# Patient Record
Sex: Male | Born: 1995 | Race: White | Hispanic: Yes | Marital: Married | Smoking: Never smoker
Health system: Southern US, Community
[De-identification: ages and names within clinical notes are randomized; demographics above are authoritative.]

---
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
## 2014-01-16 ENCOUNTER — Encounter (HOSPITAL_COMMUNITY): Payer: Self-pay | Admitting: Emergency Medicine

## 2014-01-16 DIAGNOSIS — Y9289 Other specified places as the place of occurrence of the external cause: Secondary | ICD-10-CM | POA: Insufficient documentation

## 2014-01-16 DIAGNOSIS — F17200 Nicotine dependence, unspecified, uncomplicated: Secondary | ICD-10-CM | POA: Insufficient documentation

## 2014-01-16 DIAGNOSIS — R55 Syncope and collapse: Secondary | ICD-10-CM | POA: Insufficient documentation

## 2014-01-16 DIAGNOSIS — S0083XA Contusion of other part of head, initial encounter: Principal | ICD-10-CM | POA: Insufficient documentation

## 2014-01-16 DIAGNOSIS — S060X9A Concussion with loss of consciousness of unspecified duration, initial encounter: Secondary | ICD-10-CM | POA: Insufficient documentation

## 2014-01-16 DIAGNOSIS — Y9389 Activity, other specified: Secondary | ICD-10-CM | POA: Insufficient documentation

## 2014-01-16 DIAGNOSIS — S1093XA Contusion of unspecified part of neck, initial encounter: Principal | ICD-10-CM

## 2014-01-16 DIAGNOSIS — W1809XA Striking against other object with subsequent fall, initial encounter: Secondary | ICD-10-CM | POA: Insufficient documentation

## 2014-01-16 DIAGNOSIS — S0003XA Contusion of scalp, initial encounter: Secondary | ICD-10-CM | POA: Insufficient documentation

## 2014-01-17 ENCOUNTER — Emergency Department (HOSPITAL_COMMUNITY): Payer: Self-pay

## 2014-01-17 ENCOUNTER — Emergency Department (HOSPITAL_COMMUNITY)
Admission: EM | Admit: 2014-01-17 | Discharge: 2014-01-17 | Disposition: A | Discharge status: Home / Self Care | Payer: Self-pay | Attending: Emergency Medicine | Admitting: Emergency Medicine

## 2014-01-17 DIAGNOSIS — S0003XA Contusion of scalp, initial encounter: Secondary | ICD-10-CM

## 2014-01-17 DIAGNOSIS — I951 Orthostatic hypotension: Secondary | ICD-10-CM

## 2014-01-17 LAB — CBC
HCT: 46.4 % (ref 39.0–52.0)
Hemoglobin: 16.6 g/dL (ref 13.0–17.0)
MCH: 29.8 pg (ref 26.0–34.0)
MCHC: 35.8 g/dL (ref 30.0–36.0)
MCV: 83.3 fL (ref 78.0–100.0)
Platelets: 189 10*3/uL (ref 150–400)
RBC: 5.57 MIL/uL (ref 4.22–5.81)
RDW: 12.1 % (ref 11.5–15.5)
WBC: 8.5 10*3/uL (ref 4.0–10.5)

## 2014-01-17 LAB — COMPREHENSIVE METABOLIC PANEL
ALT: 19 U/L (ref 0–53)
AST: 22 U/L (ref 0–37)
Albumin: 4.6 g/dL (ref 3.5–5.2)
Alkaline Phosphatase: 72 U/L (ref 39–117)
Anion gap: 14 (ref 5–15)
BUN: 20 mg/dL (ref 6–23)
CO2: 24 mEq/L (ref 19–32)
Calcium: 9.3 mg/dL (ref 8.4–10.5)
Chloride: 102 mEq/L (ref 96–112)
Creatinine, Ser: 0.98 mg/dL (ref 0.50–1.35)
GFR calc Af Amer: >90 mL/min (ref >90)
GFR calc non Af Amer: >90 mL/min (ref >90)
Glucose, Bld: 116 mg/dL — ABNORMAL HIGH (ref 70–99)
Potassium: 3.6 mEq/L — ABNORMAL LOW (ref 3.7–5.3)
Sodium: 140 mEq/L (ref 137–147)
Total Bilirubin: 0.8 mg/dL (ref 0.3–1.2)
Total Protein: 8.1 g/dL (ref 6.0–8.3)

## 2014-01-17 LAB — CBG MONITORING, ED: Glucose-Capillary: 93 mg/dL (ref 70–99)

## 2014-01-17 MED ORDER — SODIUM CHLORIDE 0.9 % IV SOLN: 1000.0000 mL | INTRAVENOUS | Status: DC

## 2014-01-17 MED ORDER — SODIUM CHLORIDE 0.9 % IV SOLN
1000.0000 mL | Freq: Once | INTRAVENOUS | Status: AC
  Administered 2014-01-17: 1000 mL via INTRAVENOUS

## 2014-01-17 MED ORDER — KETOROLAC TROMETHAMINE 30 MG/ML IJ SOLN
30.0000 mg | Freq: Once | INTRAMUSCULAR | Status: AC
  Administered 2014-01-17: 30 mg via INTRAVENOUS
  Filled 2014-01-17: qty 1

## 2014-01-17 NOTE — ED Provider Notes (Signed)
CSN: [id number]     Arrival date & time 01/16/14  2322 History   First MD Initiated Contact with Patient 01/17/14 0209     Chief Complaint  Patient presents with  . Loss of Consciousness     (Consider location/radiation/quality/duration/timing/severity/associated sxs/prior Treatment) Patient is a 18 y.o. male presenting with syncope. The history is provided by the patient.  Loss of Consciousness He got up from a sitting position to alter his car to get a case of water out. He was feeling lightheaded when he stood up but he proceeded and got a case of water out of his car and passed out striking the back of his head when he fell. Loss of consciousness was brief. There is no nausea, vomiting, diaphoresis, incoordination. He is complaining of pain in the back of his head they precipitate/10. He states that he been drinking fluids during the course of the day but had not eaten since early in the day. He has never had any episodes like this before.  History reviewed. No pertinent past medical history. History reviewed. No pertinent past surgical history. History reviewed. No pertinent family history. History  Substance Use Topics  . Smoking status: Current Some Day Smoker  . Smokeless tobacco: Not on file  . Alcohol Use: No    Review of Systems  Cardiovascular: Positive for syncope.  All other systems reviewed and are negative.     Allergies  Review of patient's allergies indicates no known allergies.  Home Medications   Prior to Admission medications   Not on File   BP 113/51  Pulse 64  Temp(Src) 98.2 F (36.8 C) (Oral)  Resp 17  Ht 6' (1.829 m)  Wt 190 lb (86.183 kg)  BMI 25.76 kg/m2  SpO2 98% Physical Exam  Nursing note and vitals reviewed.  18 year old male, resting comfortably and in no acute distress. Vital signs are normal. Oxygen saturation is 98%, which is normal. Head is normocephalic. There is mild tenderness to palpation over the occiput. PERRLA, EOMI.  Oropharynx is clear. Neck is nontender without adenopathy or JVD. Back is nontender and there is no CVA tenderness. Lungs are clear without rales, wheezes, or rhonchi. Chest is nontender. Heart has regular rate and rhythm without murmur. Abdomen is soft, flat, nontender without masses or hepatosplenomegaly and peristalsis is normoactive. Extremities have no cyanosis or edema, full range of motion is present. Skin is warm and dry without rash. Neurologic: Mental status is normal, cranial nerves are intact, there are no motor or sensory deficits.  ED Course  Procedures (including critical care time) Labs Review Results for orders placed during the hospital encounter of 01/17/14  CBC      Result Value Ref Range   WBC 8.5  4.0 - 10.5 K/uL   RBC 5.57  4.22 - 5.81 MIL/uL   Hemoglobin 16.6  13.0 - 17.0 g/dL   HCT 46.4  39.0 - 52.0 %   MCV 83.3  78.0 - 100.0 fL   MCH 29.8  26.0 - 34.0 pg   MCHC 35.8  30.0 - 36.0 g/dL   RDW 12.1  11.5 - 15.5 %   Platelets 189  150 - 400 K/uL  COMPREHENSIVE METABOLIC PANEL      Result Value Ref Range   Sodium 140  137 - 147 mEq/L   Potassium 3.6 (*) 3.7 - 5.3 mEq/L   Chloride 102  96 - 112 mEq/L   CO2 24  19 - 32 mEq/L   Glucose, Bld  116 (*) 70 - 99 mg/dL   BUN 20  6 - 23 mg/dL   Creatinine, Ser 0.98  0.50 - 1.35 mg/dL   Calcium 9.3  8.4 - 10.5 mg/dL   Total Protein 8.1  6.0 - 8.3 g/dL   Albumin 4.6  3.5 - 5.2 g/dL   AST 22  0 - 37 U/L   ALT 19  0 - 53 U/L   Alkaline Phosphatase 72  39 - 117 U/L   Total Bilirubin 0.8  0.3 - 1.2 mg/dL   GFR calc non Af Amer >90  >90 mL/min   GFR calc Af Amer >90  >90 mL/min   Anion gap 14  5 - 15  CBG MONITORING, ED      Result Value Ref Range   Glucose-Capillary 93  70 - 99 mg/dL   Imaging Review Ct Head Wo Contrast  01/17/2014   CLINICAL DATA:  Syncopal episode.  Hit head.  EXAM: CT HEAD WITHOUT CONTRAST  TECHNIQUE: Contiguous axial images were obtained from the base of the skull through the vertex without  intravenous contrast.  COMPARISON:  None.  FINDINGS: The ventricles are normal in size and configuration. No extra-axial fluid collections are identified. The gray-white differentiation is normal. No CT findings for acute intracranial process such as hemorrhage or infarction. No mass lesions. The brainstem and cerebellum are grossly normal.  The bony structures are intact. The paranasal sinuses and mastoid air cells are clear. The globes are intact.  IMPRESSION: No acute intracranial findings or skull fracture.   Electronically Signed   By: Kalman Jewels M.D.   On: 01/17/2014 02:44     EKG Interpretation   Date/Time:  Saturday January 17 2014 00:21:33 EDT Ventricular Rate:  79 PR Interval:  158 QRS Duration: 97 QT Interval:  378 QTC Calculation: 433 R Axis:   104 Text Interpretation:  Age not entered, assumed to be  18 years old for  purpose of ECG interpretation Sinus rhythm Consider right ventricular  hypertrophy No old tracing to compare Confirmed by [facility]  MD, Mirra Basilio  ([id number]) on 01/17/2014 12:28:31 AM      MDM   Final diagnoses:  Orthostatic syncope  Contusion of scalp, initial encounter    Syncope which sounds with a static in nature. Orthostatic vital signs did not show diagnostic change in blood pressure or heart rate but there was a increase in heart rate of beats per minute and a drop in systolic blood pressure of about 10 mm mercury. I feel he probably was dehydrated and had an orthostatic episode. He will be sent for a CT of his head and he is given IV fluids in the ED.  CT is unremarkable. He is discharged with instructions to take over-the-counter analgesics as needed for pain.  Delora Fuel, MD 40/81/44 [id number]

## 2014-01-17 NOTE — ED Notes (Signed)
Patient arrives with complaint of loss of consciousness which occurred about 1 hr ago while moving cases of water from car. Family states patient "fell out" on the pavement by the car and they hear a "crack" when his head hit the ground. Repetitive questioning reported by family. Patient was unconscious for 1-2 minutes per family members, was pale, and non-responsive to any stimuli. Patient was seen by EMS at time of injury. Currently A+O x4, complaining of mild posterior head pain. No obvious injury at site of impact. Patient ambulatory in triage without difficulty.

## 2014-01-17 NOTE — Discharge Instructions (Signed)
Drink plenty of fluids. Take acetaminophen (Tylenol), ibuprofen (Motrin, Advil), or naproxen (Aleve) as needed for pain.   Contusion A contusion is a deep bruise. Contusions are the result of an injury that caused bleeding under the skin. The contusion may turn blue, purple, or yellow. Minor injuries will give you a painless contusion, but more severe contusions may stay painful and swollen for a few weeks.  CAUSES  A contusion is usually caused by a blow, trauma, or direct force to an area of the body. SYMPTOMS   Swelling and redness of the injured area.  Bruising of the injured area.  Tenderness and soreness of the injured area.  Pain. DIAGNOSIS  The diagnosis can be made by taking a history and physical exam. An X-ray, CT scan, or MRI may be needed to determine if there were any associated injuries, such as fractures. TREATMENT  Specific treatment will depend on what area of the body was injured. In general, the best treatment for a contusion is resting, icing, elevating, and applying cold compresses to the injured area. Over-the-counter medicines may also be recommended for pain control. Ask your caregiver what the best treatment is for your contusion. HOME CARE INSTRUCTIONS   Put ice on the injured area.  Put ice in a plastic bag.  Place a towel between your skin and the bag.  Leave the ice on for 15-20 minutes, 3-4 times a day, or as directed by your health care provider.  Only take over-the-counter or prescription medicines for pain, discomfort, or fever as directed by your caregiver. Your caregiver may recommend avoiding anti-inflammatory medicines (aspirin, ibuprofen, and naproxen) for 48 hours because these medicines may increase bruising.  Rest the injured area.  If possible, elevate the injured area to reduce swelling. SEEK IMMEDIATE MEDICAL CARE IF:   You have increased bruising or swelling.  You have pain that is getting worse.  Your swelling or pain is not  relieved with medicines. MAKE SURE YOU:   Understand these instructions.  Will watch your condition.  Will get help right away if you are not doing well or get worse. Document Released: 02/01/2005 Document Revised: 04/29/2013 Document Reviewed: 02/27/2011 [facility] Patient Information 2015 [place] [address] [place], [place]. This information is not intended to replace advice given to you by your health care provider. Make sure you discuss any questions you have with your health care provider.  Syncope Syncope is a medical term for fainting or passing out. This means you lose consciousness and drop to the ground. People are generally unconscious for less than 5 minutes. You may have some muscle twitches for up to 15 seconds before waking up and returning to normal. Syncope occurs more often in older adults, but it can happen to anyone. While most causes of syncope are not dangerous, syncope can be a sign of a serious medical problem. It is important to seek medical care.  CAUSES  Syncope is caused by a sudden drop in blood flow to the brain. The specific cause is often not determined. Factors that can bring on syncope include:  Taking medicines that lower blood pressure.  Sudden changes in posture, such as standing up quickly.  Taking more medicine than prescribed.  Standing in one place for too long.  Seizure disorders.  Dehydration and excessive exposure to heat.  Low blood sugar (hypoglycemia).  Straining to have a bowel movement.  Heart disease, irregular heartbeat, or other circulatory problems.  Fear, emotional distress, seeing blood, or severe pain. SYMPTOMS  Right before fainting,  you may:  Feel dizzy or light-headed.  Feel nauseous.  See all white or all black in your field of vision.  Have cold, clammy skin. DIAGNOSIS  Your health care provider will ask about your symptoms, perform a physical exam, and perform an electrocardiogram (ECG) to record the electrical activity of your  heart. Your health care provider may also perform other heart or blood tests to determine the cause of your syncope which may include:  Transthoracic echocardiogram (TTE). During echocardiography, sound waves are used to evaluate how blood flows through your heart.  Transesophageal echocardiogram (TEE).  Cardiac monitoring. This allows your health care provider to monitor your heart rate and rhythm in real time.  Holter monitor. This is a portable device that records your heartbeat and can help diagnose heart arrhythmias. It allows your health care provider to track your heart activity for several days, if needed.  Stress tests by exercise or by giving medicine that makes the heart beat faster. TREATMENT  In most cases, no treatment is needed. Depending on the cause of your syncope, your health care provider may recommend changing or stopping some of your medicines. HOME CARE INSTRUCTIONS  Have someone stay with you until you feel stable.  Do not drive, use machinery, or play sports until your health care provider says it is okay.  Keep all follow-up appointments as directed by your health care provider.  Lie down right away if you start feeling like you might faint. Breathe deeply and steadily. Wait until all the symptoms have passed.  Drink enough fluids to keep your urine clear or pale yellow.  If you are taking blood pressure or heart medicine, get up slowly and take several minutes to sit and then stand. This can reduce dizziness. SEEK IMMEDIATE MEDICAL CARE IF:   You have a severe headache.  You have unusual pain in the chest, abdomen, or back.  You are bleeding from your mouth or rectum, or you have black or tarry stool.  You have an irregular or very fast heartbeat.  You have pain with breathing.  You have repeated fainting or seizure-like jerking during an episode.  You faint when sitting or lying down.  You have confusion.  You have trouble walking.  You have  severe weakness.  You have vision problems. If you fainted, call your local emergency services (911 in [place] Do not drive yourself to the hospital.  MAKE SURE YOU:  Understand these instructions.  Will watch your condition.  Will get help right away if you are not doing well or get worse. Document Released: 04/24/2005 Document Revised: 04/29/2013 Document Reviewed: 06/23/2011 [facility] Patient Information 2015 [place], [place]. This information is not intended to replace advice given to you by your health care provider. Make sure you discuss any questions you have with your health care provider.

## 2014-01-17 NOTE — ED Notes (Signed)
CBG 93 

## 2014-01-17 NOTE — ED Notes (Signed)
Pt with small raised area on the back of his head.

## 2016-08-17 ENCOUNTER — Ambulatory Visit (INDEPENDENT_AMBULATORY_CARE_PROVIDER_SITE_OTHER): Payer: Managed Care, Other (non HMO) | Admitting: Nurse Practitioner

## 2016-08-17 ENCOUNTER — Encounter: Payer: Self-pay | Admitting: Nurse Practitioner

## 2016-08-17 VITALS — BP 124/80 | HR 52 | Temp 98.2°F | Ht 71.0 in | Wt 187.0 lb

## 2016-08-17 DIAGNOSIS — L918 Other hypertrophic disorders of the skin: Secondary | ICD-10-CM | POA: Diagnosis not present

## 2016-08-17 DIAGNOSIS — Z13220 Encounter for screening for lipoid disorders: Secondary | ICD-10-CM | POA: Diagnosis not present

## 2016-08-17 DIAGNOSIS — Z136 Encounter for screening for cardiovascular disorders: Secondary | ICD-10-CM

## 2016-08-17 DIAGNOSIS — Z0001 Encounter for general adult medical examination with abnormal findings: Secondary | ICD-10-CM | POA: Diagnosis not present

## 2016-08-17 DIAGNOSIS — Z114 Encounter for screening for human immunodeficiency virus [HIV]: Secondary | ICD-10-CM | POA: Diagnosis not present

## 2016-08-17 NOTE — Progress Notes (Signed)
Subjective:    Patient ID: Billy Espinoza, male    DOB: [birth year], 23 y.o.   MRN: [id number]  Patient presents today for establish care (new patient)  HPI   left axillary lesion: onset several years ago. Waxing and waning Associated with pain. Does not shave. Left axillary lesion, increased in size over last few months and become more irritated No recent HIV test. No hx of STD.  Immunizations: (TDAP, Hep C screen, Pneumovax, Influenza, zoster)  Health Maintenance  Topic Date Due  . HIV Screening  11/10/2008  . Tetanus Vaccine  08/06/2017*  . Flu Shot  12/06/2016  *Topic was postponed. The date shown is not the original due date.   Diet:healthy Weight:  Wt Readings from Last 3 Encounters:  08/17/16 187 lb (84.8 kg)  01/16/14 190 lb (86.2 kg)   Exercise:none Fall Risk: fall as result of acute illness which led to dehydration, he was evaluated by provider in ED (unable to remember name). No acute finding. Fall Risk  08/17/2016  Falls in the past year? Yes  Number falls in past yr: 1  Injury with Fall? No   Home Safety: home with wife and son Depression/Suicide: Depression screen [facility] 2/9 08/17/2016  Decreased Interest 0  Down, Depressed, Hopeless 0  PHQ - 2 Score 0   No flowsheet data found. Vision:needed Dental:needed Advanced Directive: Advanced Directives 01/16/2014  Does Patient Have a Medical Advance Directive? No   Sexual History (birth control, marital status, STD):married, sexually active with wife only.  Medications and allergies reviewed with patient and updated if appropriate.  There are no active problems to display for this patient.   No current outpatient prescriptions on file prior to visit.   No current facility-administered medications on file prior to visit.     History reviewed. No pertinent past medical history.  History reviewed. No pertinent surgical history.  Social History   Social History  . Marital status: Single    Spouse  name: N/A  . Number of children: N/A  . Years of education: N/A   Social History Main Topics  . Smoking status: Never Smoker  . Smokeless tobacco: Never Used  . Alcohol use No  . Drug use: No  . Sexual activity: Not Asked   Other Topics Concern  . None   Social History Narrative  . None    Family History  Problem Relation Age of Onset  . Diabetes Maternal Grandmother         Review of Systems  Constitutional: Negative for fever, malaise/fatigue and weight loss.  HENT: Negative for congestion and sore throat.   Eyes:       Negative for visual changes  Respiratory: Negative for cough and shortness of breath.   Cardiovascular: Negative for chest pain, palpitations and leg swelling.  Gastrointestinal: Negative for blood in stool, constipation, diarrhea and heartburn.  Genitourinary: Negative for dysuria, frequency and urgency.  Musculoskeletal: Negative for falls, joint pain and myalgias.  Skin: Negative for rash.  Neurological: Negative for dizziness, sensory change and headaches.  Endo/Heme/Allergies: Does not bruise/bleed easily.  Psychiatric/Behavioral: Negative for depression, substance abuse and suicidal ideas. The patient is not nervous/anxious.     Objective:   Vitals:   08/17/16 1130  BP: 124/80  Pulse: (!) 52  Temp: 98.2 F (36.8 C)    Body mass index is 26.08 kg/m.   Physical Examination:  Physical Exam  Constitutional: He is oriented to person, place, and time and well-developed, well-nourished, and in  no distress. No distress.  HENT:  Right Ear: External ear normal.  Left Ear: External ear normal.  Nose: Nose normal.  Mouth/Throat: Oropharynx is clear and moist. No oropharyngeal exudate.  Eyes: Conjunctivae and EOM are normal. Pupils are equal, round, and reactive to light. No scleral icterus.  Neck: Normal range of motion. Neck supple. No thyromegaly present.  Cardiovascular: Normal rate, normal heart sounds and intact distal pulses.     Pulmonary/Chest: Effort normal and breath sounds normal. He exhibits no tenderness.  Abdominal: Soft. Bowel sounds are normal. He exhibits no distension. There is no tenderness.  Musculoskeletal: Normal range of motion. He exhibits no edema or tenderness.  Lymphadenopathy:    He has no cervical adenopathy.  Neurological: He is alert and oriented to person, place, and time. Gait normal.  Skin: Skin is warm and dry. Lesion noted. No rash noted.  Hyperpigmented left axilla lesion, non tender, no induration.  Psychiatric: Affect and judgment normal.   Left Axilla Biopsy: Procedure including risks/benefits explained to patient.   Questions were answered.  After verbal informed consent was obtained and a time out completed. Site was cleansed with betadine and then alcohol. 1% Lidocaine with epinephrine was injected under lesion and then shave biopsy was performed. Area was cauterized to obtain hemostasis.  Pt tolerated procedure well.  Specimen sent for pathology review.  Pt instructed to keep the area dry for 24 hours and to contact us if he develops redness, drainage or swelling at the site.  Pt may use tylenol as needed for discomfort today.   ASSESSMENT and PLAN:  [name] was seen today for establish care.  Diagnoses and all orders for this visit:  Encounter for preventative adult health care exam with abnormal findings -     CBC; Future -     HIV antibody; Future -     Comprehensive metabolic panel; Future -     Lipid panel; Future -     TSH; Future  Inflamed skin tag -     Dermatology pathology  Encounter for lipid screening for cardiovascular disease -     Lipid panel; Future  Screening for HIV (human immunodeficiency virus) -     HIV antibody; Future   No problem-specific Assessment & Plan notes found for this encounter.     Follow up: Return if symptoms worsen or fail to improve.  Wilfred Lacy, NP

## 2016-08-17 NOTE — Patient Instructions (Signed)
Return to lab fasting at least 6-8hrs prior to blood draw.   Skin Biopsy, Care After Refer to this sheet in the next few weeks. These instructions provide you with information about caring for yourself after your procedure. Your health care provider may also give you more specific instructions. Your treatment has been planned according to current medical practices, but problems sometimes occur. Call your health care provider if you have any problems or questions after your procedure. What can I expect after the procedure? After the procedure, it is common to have:  Soreness.  Bruising.  Itching. Follow these instructions at home:  Rest and then return to your normal activities as told by your health care provider.  Take over-the-counter and prescription medicines only as told by your health care provider.  Follow instructions from your health care provider about how to take care of your biopsy site.Make sure you:  Wash your hands with soap and water before you change your bandage (dressing). If soap and water are not available, use hand sanitizer.  Change your dressing as told by your health care provider.  Leave stitches (sutures), skin glue, or adhesive strips in place. These skin closures may need to stay in place for 2 weeks or longer. If adhesive strip edges start to loosen and curl up, you may trim the loose edges. Do not remove adhesive strips completely unless your health care provider tells you to do that. If the biopsy area bleeds, apply gentle pressure for 10 minutes.  Check your biopsy site every day for signs of infection. Check for:  More redness, swelling, or pain.  More fluid or blood.  Warmth.  Pus or a bad smell.  Keep all follow-up visits as told by your health care provider. This is important. Contact a health care provider if:  You have more redness, swelling, or pain around your biopsy site.  You have more fluid or blood coming from your biopsy  site.  Your biopsy site feels warm to the touch.  You have pus or a bad smell coming from your biopsy site.  You have a fever. Get help right away if:  You have bleeding that does not stop with pressure or a dressing. This information is not intended to replace advice given to you by your health care provider. Make sure you discuss any questions you have with your health care provider. Document Released: 05/21/2015 Document Revised: 12/19/2015 Document Reviewed: 07/22/2014 Elsevier Interactive Patient Education  2017 [employer].

## 2016-08-17 NOTE — Progress Notes (Signed)
Pre visit review using our clinic review tool, if applicable. No additional management support is needed unless otherwise documented below in the visit note. 

## 2016-08-22 ENCOUNTER — Encounter: Payer: Self-pay | Admitting: Nurse Practitioner

## 2016-12-17 ENCOUNTER — Encounter (HOSPITAL_COMMUNITY): Payer: Self-pay | Admitting: Emergency Medicine

## 2016-12-17 ENCOUNTER — Ambulatory Visit (INDEPENDENT_AMBULATORY_CARE_PROVIDER_SITE_OTHER): Payer: Managed Care, Other (non HMO)

## 2016-12-17 ENCOUNTER — Ambulatory Visit (HOSPITAL_COMMUNITY)
Admission: EM | Admit: 2016-12-17 | Discharge: 2016-12-17 | Disposition: A | Discharge status: Home / Self Care | Payer: Managed Care, Other (non HMO) | Attending: Family Medicine | Admitting: Family Medicine

## 2016-12-17 DIAGNOSIS — M659 Synovitis and tenosynovitis, unspecified: Secondary | ICD-10-CM

## 2016-12-17 DIAGNOSIS — M65931 Unspecified synovitis and tenosynovitis, right forearm: Secondary | ICD-10-CM

## 2016-12-17 MED ORDER — MELOXICAM 15 MG PO TABS: 15.0000 mg | ORAL_TABLET | Freq: Every day | ORAL | 0 refills | Status: DC

## 2016-12-17 NOTE — Discharge Instructions (Signed)
Your x-rays were normal. You most likely have a condition called tenosynovitis. Recommend rest, ice, elevation when possible. We have placed her wrist in a splint. I recommend wearing it as much a possible, and especially every night at bedtime. I prescribed a medicine called meloxicam, take one tablet daily. You can take Tylenol with this if needed, but no Motrin, naproxen, or or over-the-counter NSAIDs. If pain persists past 2-3 weeks, follow up with your primary care provider, as you may need a referral to physical therapy

## 2016-12-17 NOTE — ED Triage Notes (Signed)
The patient presented to the [facility] with a complaint of right wrist pain that has been chronic but got worse last night after bowling.

## 2016-12-17 NOTE — ED Provider Notes (Signed)
  [place]   [id number] 12/17/16 Arrival Time: 4098  ASSESSMENT & PLAN:  1. Tenosynovitis of right wrist     Meds ordered this encounter  Medications  . meloxicam (MOBIC) 15 MG tablet    Sig: Take 1 tablet (15 mg total) by mouth daily.    Dispense:  30 tablet    Refill:  0    Order Specific Question:   Supervising Provider    Answer:   Robyn Haber [[id number]]   Recommend RICE, Anti-inflammatories, follow-up with primary care Reviewed expectations re: course of current medical issues. Questions answered. Outlined signs and symptoms indicating need for more acute intervention. Patient verbalized understanding. After Visit Summary given.   SUBJECTIVE:  Billy Espinoza is a 21 y.o. male who presents with complaint of right wrist pain has been ongoing for approximately one year, but worsened after last night while bowling. He denies any trauma, has not fallen, or had any injury. However, he does work in a job with lots of repetitive motion. He has a Clinical research associate for [employer]. He has no swelling, no systemic symptoms such as fever or chills. Otherwise history is unremarkable  ROS: As per HPI, remainder of ROS negative.   OBJECTIVE:  Vitals:   12/17/16 1257  BP: 125/77  Pulse: (!) 58  Resp: 16  Temp: 98.6 F (37 C)  TempSrc: Oral  SpO2: 99%     General appearance: alert; no distress, Appears stated age HEENT: normocephalic; atraumatic; conjunctivae normal;  Extremities: no cyanosis or edema; symmetrical with no gross deformities; negative Finkelstein, negative Tinel, negative Phalen, negative point tenderness on the extensor tendons medially, also tenderness distal to the on the styloid. Grip strengths normal, no decrease in sensory function along the ulnar, medial, radial nerves, capillary refill is less than 2 seconds Skin: warm and dry Neurologic: Grossly normal Psychological:  alert and cooperative; normal mood and affect  Procedures:      Labs Reviewed -  No data to display  Dg Wrist Complete Right  Result Date: 12/17/2016 CLINICAL DATA:  Pain EXAM: RIGHT WRIST - COMPLETE 3+ VIEW COMPARISON:  None. FINDINGS: There is no evidence of fracture or dislocation. There is no evidence of arthropathy or other focal bone abnormality. Soft tissues are unremarkable. IMPRESSION: Negative. Electronically Signed   By: Franchot Gallo M.D.   On: 12/17/2016 13:58    No Known Allergies  PMHx, SurgHx, SocialHx, Medications, and Allergies were reviewed in the Visit Navigator and updated as appropriate.       Barnet Glasgow, NP 12/17/16 [phone]

## 2018-02-26 ENCOUNTER — Ambulatory Visit (INDEPENDENT_AMBULATORY_CARE_PROVIDER_SITE_OTHER): Payer: BLUE CROSS/BLUE SHIELD | Admitting: Nurse Practitioner

## 2018-02-26 ENCOUNTER — Encounter: Payer: Self-pay | Admitting: Nurse Practitioner

## 2018-02-26 VITALS — BP 132/72 | HR 71 | Temp 98.6°F | Ht 71.0 in | Wt 200.0 lb

## 2018-02-26 DIAGNOSIS — Z136 Encounter for screening for cardiovascular disorders: Secondary | ICD-10-CM

## 2018-02-26 DIAGNOSIS — Z13220 Encounter for screening for lipoid disorders: Secondary | ICD-10-CM

## 2018-02-26 DIAGNOSIS — Z0000 Encounter for general adult medical examination without abnormal findings: Secondary | ICD-10-CM | POA: Diagnosis not present

## 2018-02-26 DIAGNOSIS — Z114 Encounter for screening for human immunodeficiency virus [HIV]: Secondary | ICD-10-CM

## 2018-02-26 DIAGNOSIS — Z23 Encounter for immunization: Secondary | ICD-10-CM | POA: Diagnosis not present

## 2018-02-26 LAB — LIPID PANEL
CHOLESTEROL: 179 mg/dL (ref 0–200)
HDL: 64.6 mg/dL (ref >39.00)
LDL Cholesterol: 90 mg/dL (ref 0–99)
NonHDL: 114.89
Total CHOL/HDL Ratio: 3
Triglycerides: 126 mg/dL (ref 0.0–149.0)
VLDL: 25.2 mg/dL (ref 0.0–40.0)

## 2018-02-26 LAB — COMPREHENSIVE METABOLIC PANEL
ALBUMIN: 4.7 g/dL (ref 3.5–5.2)
ALK PHOS: 65 U/L (ref 39–117)
ALT: 16 U/L (ref 0–53)
AST: 15 U/L (ref 0–37)
BUN: 15 mg/dL (ref 6–23)
CALCIUM: 9.6 mg/dL (ref 8.4–10.5)
CO2: 30 mEq/L (ref 19–32)
Chloride: 103 mEq/L (ref 96–112)
Creatinine, Ser: 0.81 mg/dL (ref 0.40–1.50)
GFR: 124.12 mL/min (ref >60.00)
Glucose, Bld: 94 mg/dL (ref 70–99)
POTASSIUM: 4 meq/L (ref 3.5–5.1)
Sodium: 139 mEq/L (ref 135–145)
Total Bilirubin: 0.5 mg/dL (ref 0.2–1.2)
Total Protein: 7.5 g/dL (ref 6.0–8.3)

## 2018-02-26 LAB — CBC
HCT: 47.7 % (ref 39.0–52.0)
HEMOGLOBIN: 16.5 g/dL (ref 13.0–17.0)
MCHC: 34.6 g/dL (ref 30.0–36.0)
MCV: 87 fl (ref 78.0–100.0)
PLATELETS: 191 10*3/uL (ref 150.0–400.0)
RBC: 5.48 Mil/uL (ref 4.22–5.81)
RDW: 12.7 % (ref 11.5–15.5)
WBC: 4.4 10*3/uL (ref 4.0–10.5)

## 2018-02-26 LAB — TSH: TSH: 1.54 u[IU]/mL (ref 0.35–4.50)

## 2018-02-26 NOTE — Progress Notes (Signed)
Subjective:    Patient ID: Billy Espinoza, male    DOB: [birth year], 22 y.o.   MRN: [id number]  Patient presents today for complete physical   HPI  Denies any acute complains. No change in health since last OV. 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 Denies any injuries at work in last 57months.  Sexual History (orientation,birth control, marital status, STD):married, sexually active with wife only, father of 2boys  Depression/Suicide: Depression screen [facility] 2/9 02/26/2018 08/17/2016  Decreased Interest 0 0  Down, Depressed, Hopeless 0 0  PHQ - 2 Score 0 0    Vision: not needed per patient  Dental:will schedule.  Immunizations: (TDAP, Hep C screen, Pneumovax, Influenza, zoster)  Health Maintenance  Topic Date Due  . HIV Screening  11/10/2008  . Tetanus Vaccine  02/27/2028  . Flu Shot  Completed   Diet:regular.  Weight:  Wt Readings from Last 3 Encounters:  02/26/18 200 lb (90.7 kg)  08/17/16 187 lb (84.8 kg)  01/16/14 190 lb (86.2 kg)   Exercise:walking, lifting  Fall Risk: Fall Risk  02/26/2018 08/17/2016  Falls in the past year? No Yes  Number falls in past yr: - 1  Injury with Fall? - No   Advanced Directive: Advanced Directives 01/16/2014  Does Patient Have a Medical Advance Directive? No     Medications and allergies reviewed with patient and updated if appropriate.  There are no active problems to display for this patient.   No current outpatient medications on file prior to visit.   No current facility-administered medications on file prior to visit.     History reviewed. No pertinent past medical history.  History reviewed. No pertinent surgical history.  Social History   Socioeconomic History  . Marital status: Married    Spouse name: Not on file  . Number of children: 2  . Years of education: Not on file  . Highest education level: Not on file  Occupational  History  . Not on file  Social Needs  . Financial resource strain: Not on file  . Food insecurity:    Worry: Not on file    Inability: Not on file  . Transportation needs:    Medical: Not on file    Non-medical: Not on file  Tobacco Use  . Smoking status: Never Smoker  . Smokeless tobacco: Never Used  Substance and Sexual Activity  . Alcohol use: No  . Drug use: No  . Sexual activity: Yes  Lifestyle  . Physical activity:    Days per week: Not on file    Minutes per session: Not on file  . Stress: Not on file  Relationships  . Social connections:    Talks on phone: Not on file    Gets together: Not on file    Attends religious service: Not on file    Active member of club or organization: Not on file    Attends meetings of clubs or organizations: Not on file    Relationship status: Not on file  Other Topics Concern  . Not on file  Social History Narrative  . Not on file    Family History  Problem Relation Age of Onset  . Diabetes Maternal Grandmother         Review of Systems  Constitutional: Negative for fever, malaise/fatigue and weight loss.  HENT: Negative for congestion and sore throat.   Eyes:       Negative  for visual changes  Respiratory: Negative for cough and shortness of breath.   Cardiovascular: Negative for chest pain, palpitations and leg swelling.  Gastrointestinal: Negative for blood in stool, constipation, diarrhea and heartburn.  Genitourinary: Negative for dysuria, frequency and urgency.  Musculoskeletal: Negative for falls, joint pain and myalgias.  Skin: Negative for rash.  Neurological: Negative for dizziness, sensory change and headaches.  Endo/Heme/Allergies: Does not bruise/bleed easily.  Psychiatric/Behavioral: Negative for depression, substance abuse and suicidal ideas. The patient is not nervous/anxious.     Objective:   Vitals:   02/26/18 0954  BP: 132/72  Pulse: 71  Temp: 98.6 F (37 C)  SpO2: 97%    Body mass index is  27.89 kg/m.   Physical Examination:  Physical Exam  Constitutional: He is oriented to person, place, and time. He appears well-developed. No distress.  HENT:  Right Ear: External ear normal.  Left Ear: External ear normal.  Nose: Nose normal.  Mouth/Throat: Oropharynx is clear and moist. No oropharyngeal exudate.  Eyes: Pupils are equal, round, and reactive to light. Conjunctivae and EOM are normal.  Neck: Normal range of motion. Neck supple.  Cardiovascular: Normal rate, regular rhythm and normal heart sounds.  Pulmonary/Chest: Effort normal and breath sounds normal. No respiratory distress. He exhibits no tenderness.  Abdominal: Soft. Bowel sounds are normal. He exhibits no distension. There is no tenderness. There is no guarding.  Genitourinary:  Genitourinary Comments: Declined. Reports he performs testicular exam at home. No abnormality noted  Musculoskeletal: Normal range of motion. He exhibits no edema.  Lymphadenopathy:    He has no cervical adenopathy.  Neurological: He is alert and oriented to person, place, and time. He has normal reflexes.  Skin: Skin is warm and dry. No rash noted.  Psychiatric: He has a normal mood and affect. His behavior is normal. Thought content normal.  Vitals reviewed.   ASSESSMENT and PLAN:  Loki was seen today for annual exam.  Diagnoses and all orders for this visit:  Preventative health care -     TSH -     CBC -     Comprehensive metabolic panel -     Lipid panel -     HIV Antibody (routine testing w rflx)  Encounter for lipid screening for cardiovascular disease -     Lipid panel  Encounter for screening for human immunodeficiency virus (HIV) -     HIV Antibody (routine testing w rflx)  Need for influenza vaccination -     Flu Vaccine QUAD 36+ mos IM  Need for diphtheria-tetanus-pertussis (Tdap) vaccine -     Tdap vaccine greater than or equal to 7yo IM   No problem-specific Assessment & Plan notes found for this  encounter.     Follow up: Return if symptoms worsen or fail to improve.  Wilfred Lacy, NP

## 2018-02-26 NOTE — Patient Instructions (Signed)
Go to lab for blood draw  Preventive Care for Lamb, Male The transition to life after high school as a young adult can be a stressful time with many changes. You may start seeing a primary care physician instead of a pediatrician. This is the time when your health care becomes your responsibility. Preventive care refers to lifestyle choices and visits with your health care provider that can promote health and wellness. What does preventive care include?  A yearly physical exam. This is also called an annual wellness visit.  Dental exams once or twice a year.  Routine eye exams. Ask your health care provider how often you should have your eyes checked.  Personal lifestyle choices, including: ? Daily care of your teeth and gums. ? Regular physical activity. ? Eating a healthy diet. ? Avoiding tobacco and drug use. ? Avoiding or limiting alcohol use. ? Practicing safe sex. What happens during an annual wellness visit? Preventive care starts with a yearly visit to your primary care physician. The services and screenings done by your health care provider during your annual wellness visit will depend on your overall health, lifestyle risk factors, and family history of disease. Counseling Your health care provider may ask you questions about:  Past medical problems and your family's medical history.  Medicines or supplements that you take.  Health insurance and access to health care.  Alcohol, tobacco, and drug use, including use of any bodybuilding drugs (anabolic steroids).  Your safety at home, work, or school.  Access to firearms.  Emotional well-being and how you cope with stress.  Relationship well-being.  Diet, exercise, and sleep habits.  Your sexual health and activity.  Screening You may have the following tests or measurements:  Height, weight, and BMI.  Blood pressure.  Lipid and cholesterol levels.  Tuberculosis skin test.  Skin exam.  Vision  and hearing tests.  Genital exam to check for testicular cancer or hernias.  Screening test for hepatitis.  Screening tests for STDs (sexually transmitted diseases), if you are at risk.  Vaccines Your health care provider may recommend certain vaccines, such as:  Influenza vaccine. This is recommended every year.  Tetanus, diphtheria, and acellular pertussis (Tdap, Td) vaccine. You may need a Td booster every 10 years.  Varicella vaccine. You may need this if you have not been vaccinated.  HPV vaccine. If you are 106 or younger, you may need three doses over 6 months.  Measles, mumps, and rubella (MMR) vaccine. You may need at least one dose of MMR. You may also need a second dose.  Pneumococcal 13-valent conjugate (PCV13) vaccine. You may need this if you have certain conditions and have not been vaccinated.  Pneumococcal polysaccharide (PPSV23) vaccine. You may need one or two doses if you smoke cigarettes or if you have certain conditions.  Meningococcal vaccine. One dose is recommended if you are age 34-21 years and a first-year college student living in a residence hall, or if you have one of several medical conditions. You may also need additional booster doses.  Hepatitis A vaccine. You may need this if you have certain conditions or if you travel or work in places where you may be exposed to hepatitis A.  Hepatitis B vaccine. You may need this if you have certain conditions or if you travel or work in places where you may be exposed to hepatitis B.  Haemophilus influenzae type b (Hib) vaccine. You may need this if you have certain risk factors.  Talk  to your health care provider about which screenings and vaccines you need and how often you need them. What steps can I take to develop healthy behaviors?  Have regular preventive health care visits with your primary care physician and dentist.  Eat a healthy diet.  Drink enough fluid to keep your urine clear or pale  yellow.  Stay active. Exercise at least 30 minutes 5 or more days of the week.  Use alcohol responsibly.  Maintain a healthy weight.  Do not use any products that contain nicotine, such as cigarettes, chewing tobacco, and e-cigarettes. If you need help quitting, ask your health care provider.  Do not use drugs.  Practice safe sex. This includes using condoms to prevent STDs or an unwanted pregnancy.  Find healthy ways to manage stress. How can I protect myself from injury? Injuries from violence or accidents are the leading cause of death among young adults and can often be prevented. Take these steps to help protect yourself:  Always wear your seat belt while driving or riding in a vehicle.  Do not drive if you have been drinking alcohol. Do not ride with someone who has been drinking.  Do not drive when you are tired or distracted. Do not text while driving.  Wear a helmet and other protective equipment during sports activities.  If you have firearms in your house, make sure you follow all gun safety procedures.  Seek help if you have been bullied, physically abused, or sexually abused.  Avoid fighting.  Use the Internet responsibly to avoid dangers such as online bullying.  What can I do to cope with stress? Young adults may face many new challenges that can be stressful, such as finding a job, going to college, moving away from home, managing money, being in a relationship, getting married, and having children. To manage stress:  Avoid known stressful situations when you can.  Exercise regularly.  Find a stress-reducing activity that works best for you. Examples include meditation, yoga, listening to music, or reading.  Spend time in nature.  Keep a journal to write about your stress and how you respond.  Talk to your health care provider about stress. He or she may suggest counseling.  Spend time with supportive friends or family.  Do not cope with stress  by: ? Drinking alcohol or using drugs. ? Smoking cigarettes. ? Eating.  Where can I get more information? Learn more about preventive care and healthy habits from:  U.S. Preventive Services Task Force: StageSync.si  National Adolescent and [place]: StrategicRoad.nl  American Academy of Pediatrics Bright Futures: https://brightfutures.MemberVerification.co.za  Society for Adolescent Health and Medicine: MoralBlog.co.za.aspx  PodExchange.nl: ToyLending.fr  This information is not intended to replace advice given to you by your health care provider. Make sure you discuss any questions you have with your health care provider. Document Released: 09/09/2015 Document Revised: 09/30/2015 Document Reviewed: 09/09/2015 Elsevier Interactive Patient Education  [employer].

## 2018-02-27 LAB — HIV ANTIBODY (ROUTINE TESTING W REFLEX): HIV 1&2 Ab, 4th Generation: NONREACTIVE

## 2018-03-06 ENCOUNTER — Encounter: Payer: Self-pay | Admitting: Nurse Practitioner

## 2018-12-03 IMAGING — DX DG WRIST COMPLETE 3+V*R*
4 series · 4 of 4 positions shown · non-contrast
Comparison: None.

CLINICAL DATA: Pain

EXAM:
RIGHT WRIST - COMPLETE 3+ VIEW

[wrist pa]
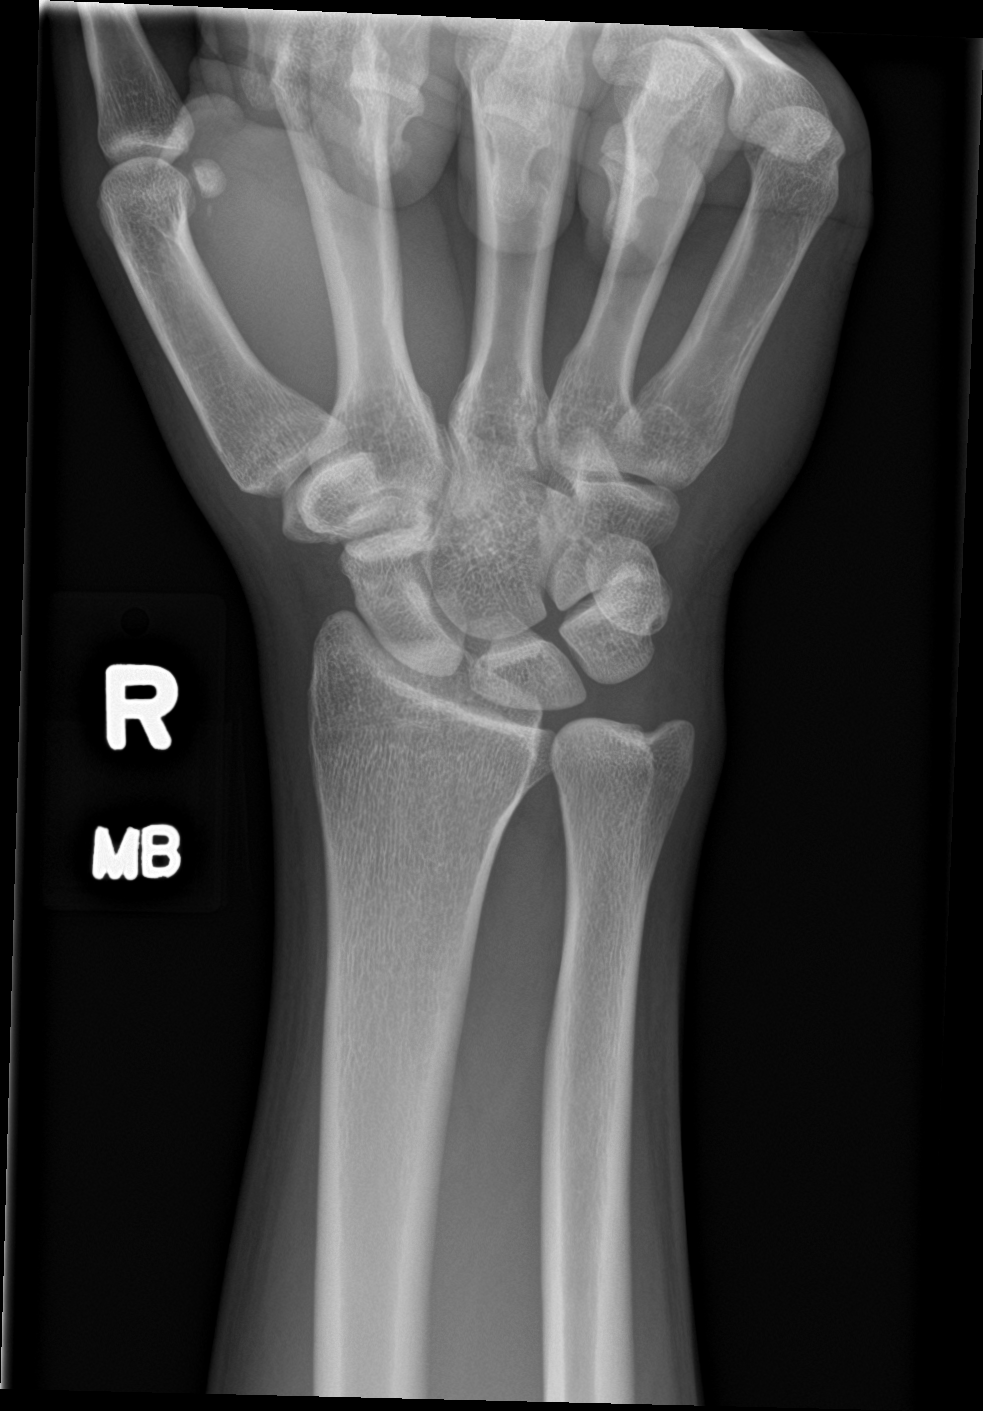

[wrist obl]
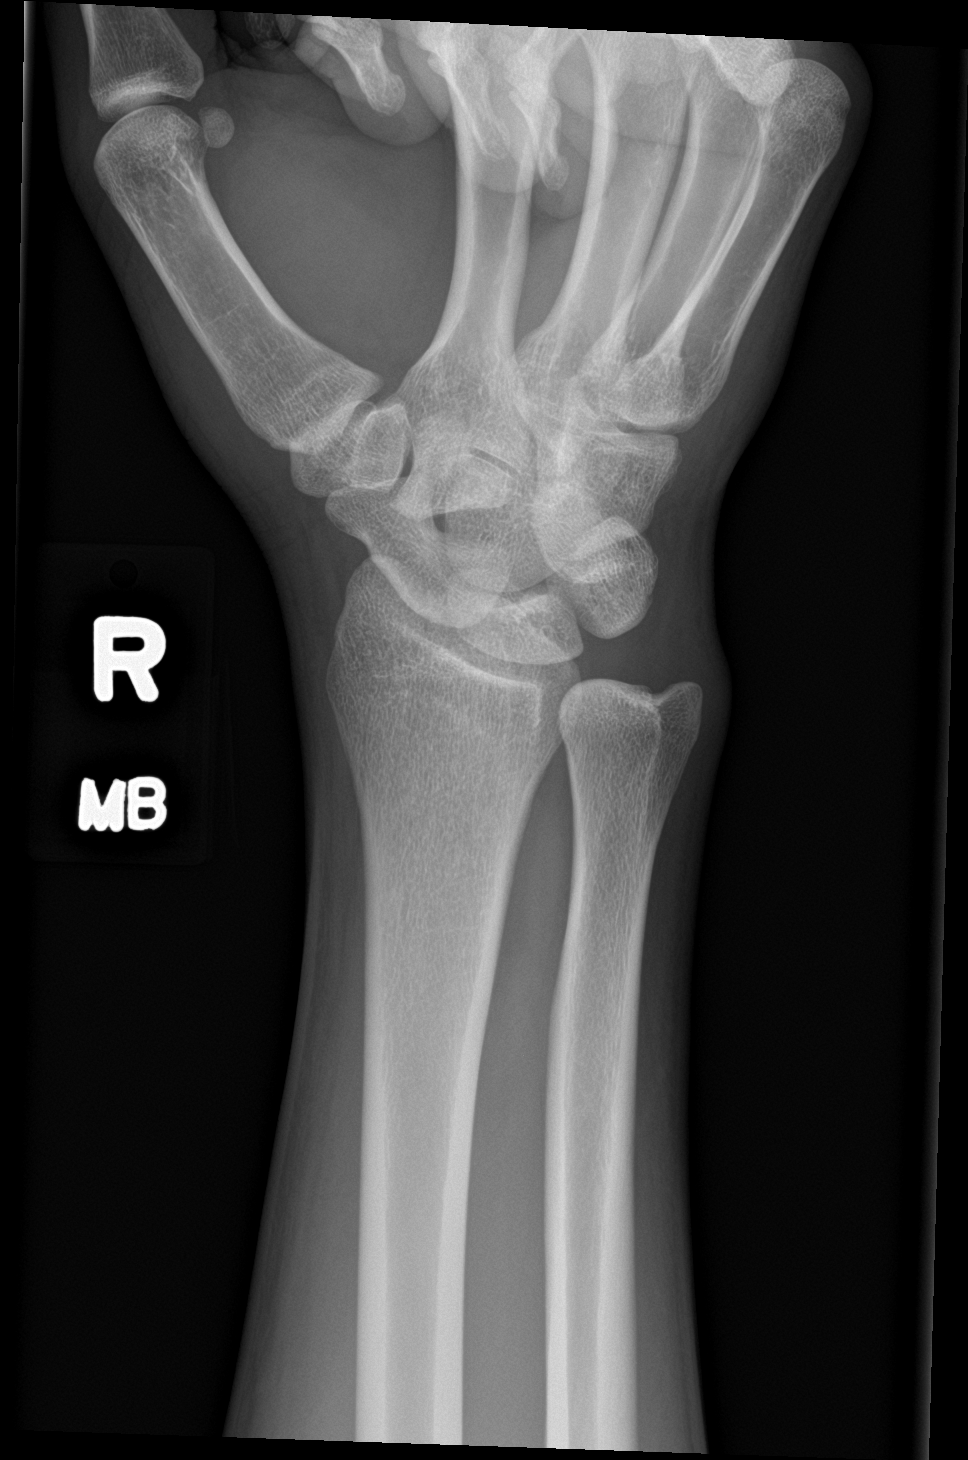

[wrist lat]
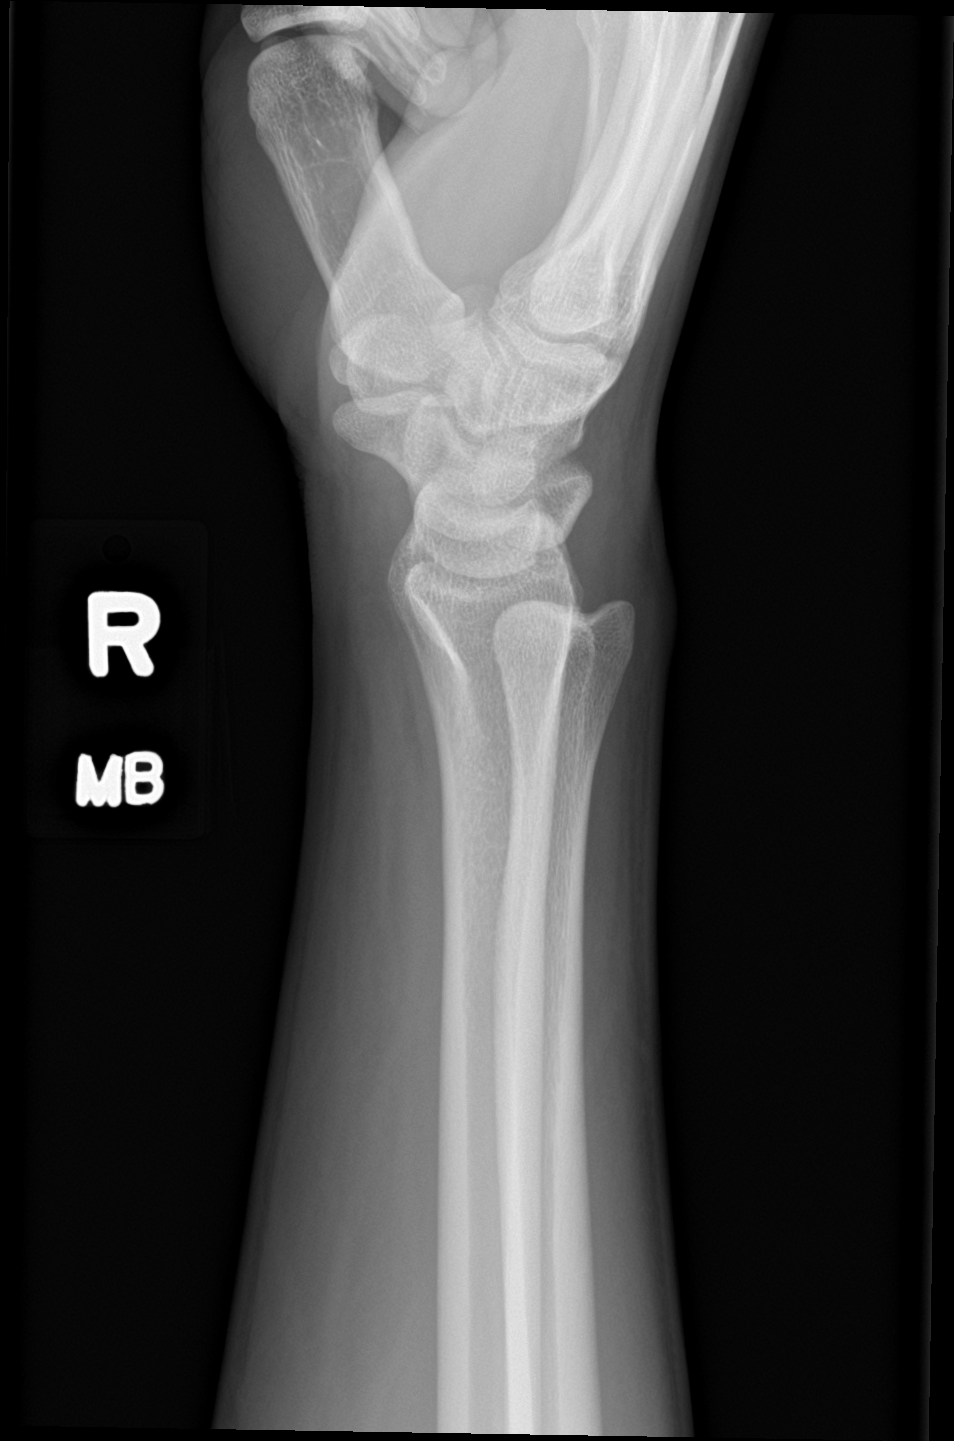

[wrist navicular]
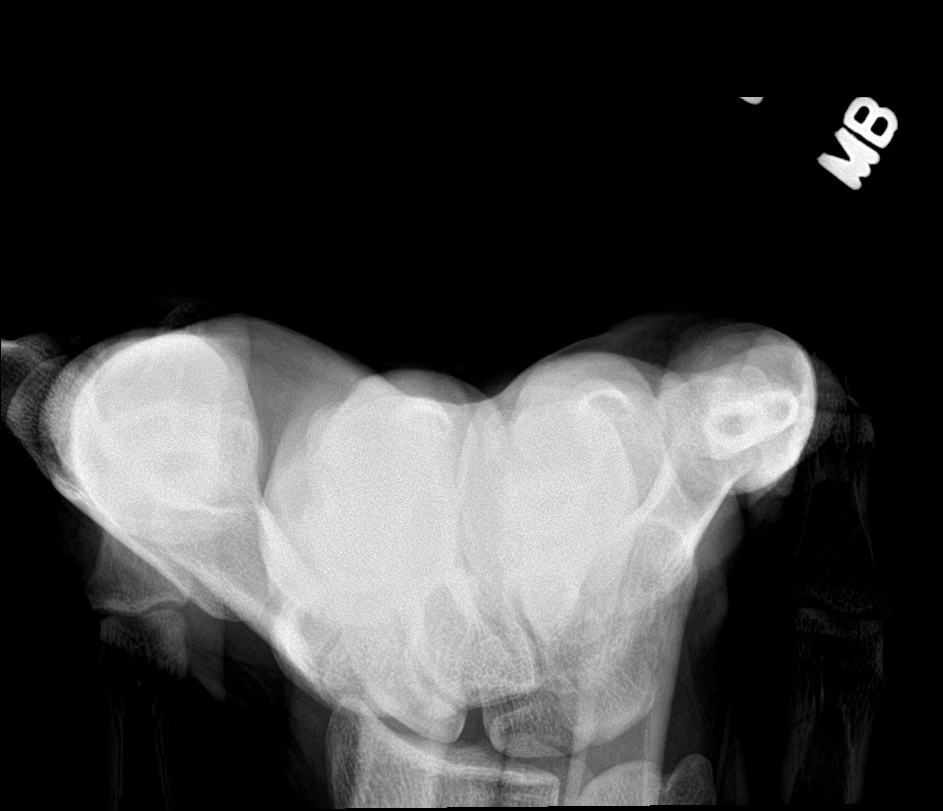

[4 of 4 positions shown; findings below may reference images not displayed]

FINDINGS: There is no evidence of fracture or dislocation. There is no
evidence of arthropathy or other focal bone abnormality. Soft
tissues are unremarkable.
IMPRESSION: Negative.

## 2019-03-21 DIAGNOSIS — Z20828 Contact with and (suspected) exposure to other viral communicable diseases: Secondary | ICD-10-CM | POA: Diagnosis not present

## 2019-05-19 DIAGNOSIS — U071 COVID-19: Secondary | ICD-10-CM | POA: Diagnosis not present

## 2019-05-21 ENCOUNTER — Encounter: Payer: BLUE CROSS/BLUE SHIELD | Admitting: Nurse Practitioner

## 2019-05-26 DIAGNOSIS — Z20828 Contact with and (suspected) exposure to other viral communicable diseases: Secondary | ICD-10-CM | POA: Diagnosis not present

## 2019-08-05 ENCOUNTER — Ambulatory Visit: Payer: Self-pay | Admitting: Nurse Practitioner

## 2019-08-05 DIAGNOSIS — Z0289 Encounter for other administrative examinations: Secondary | ICD-10-CM

## 2019-08-15 ENCOUNTER — Ambulatory Visit: Payer: BC Managed Care – PPO

## 2019-08-21 ENCOUNTER — Ambulatory Visit: Payer: BC Managed Care – PPO | Attending: Internal Medicine

## 2019-08-21 DIAGNOSIS — Z23 Encounter for immunization: Secondary | ICD-10-CM

## 2019-08-21 NOTE — Progress Notes (Signed)
   Covid-19 Vaccination Clinic  Name:  Billy Espinoza    MRN: [id number] DOB: [birth year]  08/21/2019  Mr. Dacres was observed post Covid-19 immunization for 15 minutes without incident. He was provided with Vaccine Information Sheet and instruction to access the V-Safe system.   Mr. Haughn was instructed to call 911 with any severe reactions post vaccine: Marland Kitchen Difficulty breathing  . Swelling of face and throat  . A fast heartbeat  . A bad rash all over body  . Dizziness and weakness   Immunizations Administered    Name Date Dose VIS Date Route   Pfizer COVID-19 Vaccine 08/21/2019  4:24 PM 0.3 mL 04/18/2019 Intramuscular   Manufacturer: [place]   Lot: [id number]   [place]: [id number]

## 2019-09-15 ENCOUNTER — Ambulatory Visit: Payer: BC Managed Care – PPO | Attending: Internal Medicine

## 2019-10-29 ENCOUNTER — Encounter: Payer: Self-pay | Admitting: Nurse Practitioner

## 2019-10-30 ENCOUNTER — Ambulatory Visit: Payer: BC Managed Care – PPO | Admitting: Family Medicine

## 2019-10-30 ENCOUNTER — Encounter: Payer: Self-pay | Admitting: Family Medicine

## 2019-10-30 ENCOUNTER — Other Ambulatory Visit: Payer: Self-pay

## 2019-10-30 VITALS — BP 100/70 | HR 67 | Temp 98.2°F | Ht 71.0 in | Wt 197.6 lb

## 2019-10-30 DIAGNOSIS — K120 Recurrent oral aphthae: Secondary | ICD-10-CM

## 2019-10-30 MED ORDER — TRIAMCINOLONE ACETONIDE 0.1 % MT PSTE: 1.0000 "application " | PASTE | Freq: Two times a day (BID) | OROMUCOSAL | 1 refills | Status: AC

## 2019-10-30 NOTE — Progress Notes (Signed)
Billy Espinoza is a 24 y.o. male  Chief Complaint  Patient presents with   Dental Pain    Pt c/o mouth pain, he said it hurts with any kind of mouth movement.  Pt said it feels like a sore is there, right before throat area x3 days,  worsening in the last 3 days.    HPI: Billy Espinoza is a 24 y.o. male who is a patient of Wilfred Lacy who complains of 3 day h/o mouth pain, worse when he swallows or eats. He notes a single white lesion in the back of his mouth on the Lt.  No fever, chills. No sore throat. No cough, runny nose, stuffy nose, PND. No OTC meds or treatments.   History reviewed. No pertinent past medical history.  History reviewed. No pertinent surgical history.  Social History   Socioeconomic History   Marital status: Married    Spouse name: Not on file   Number of children: 2   Years of education: Not on file   Highest education level: Not on file  Occupational History   Not on file  Tobacco Use   Smoking status: Never Smoker   Smokeless tobacco: Never Used  Substance and Sexual Activity   Alcohol use: Yes    Alcohol/week: 3.0 standard drinks    Types: 3 Cans of beer per week   Drug use: No   Sexual activity: Yes    Birth control/protection: None  Other Topics Concern   Not on file  Social History Narrative   Not on file   Social Determinants of Health   Financial Resource Strain:    Difficulty of Paying Living Expenses:   Food Insecurity:    Worried About Charity fundraiser in the Last Year:    Arboriculturist in the Last Year:   Transportation Needs:    Film/video editor (Medical):    Lack of Transportation (Non-Medical):   Physical Activity:    Days of Exercise per Week:    Minutes of Exercise per Session:   Stress:    Feeling of Stress :   Social Connections:    Frequency of Communication with Friends and Family:    Frequency of Social Gatherings with Friends and Family:    Attends Religious Services:      Active Member of Clubs or Organizations:    Attends Archivist Meetings:    Marital Status:   Intimate Partner Violence:    Fear of Current or Ex-Partner:    Emotionally Abused:    Physically Abused:    Sexually Abused:     Family History  Problem Relation Age of Onset   Diabetes Maternal Grandmother      Immunization History  Administered Date(s) Administered   DTaP 04/03/1994, 06/13/1994, 01/02/1995   Hepatitis B 05-Mar-1994, 02/03/1994, 06/13/1994   HiB (PRP-OMP) 04/03/1994, 06/13/1994, 01/02/1995   IPV 02/03/1994, 04/03/1994, 06/13/1994   Influenza,inj,Quad PF,6+ Mos 02/26/2018   MMR 01/02/1995   PFIZER SARS-COV-2 Vaccination 08/21/2019   Tdap 02/26/2018    No outpatient encounter medications on file as of 10/30/2019.   No facility-administered encounter medications on file as of 10/30/2019.     ROS: Pertinent positives and negatives noted in HPI. Remainder of ROS non-contributory    No Known Allergies  BP 100/70 (BP Location: Left Arm, Patient Position: Sitting, Cuff Size: Normal)    Pulse 67    Temp 98.2 F (36.8 C) (Temporal)    Ht 5' 11" (1.803  m)    Wt 197 lb 9.6 oz (89.6 kg)    SpO2 97%    BMI 27.56 kg/m   Physical Exam Constitutional:      Appearance: Normal appearance. He is not ill-appearing or toxic-appearing.  HENT:     Mouth/Throat:     Lips: No lesions.     Mouth: Mucous membranes are dry. Oral lesions (Lt posterior buccal mucosa with solitary apthous ulcer) present.     Tongue: No lesions.     Palate: No lesions.     Tonsils: No tonsillar exudate.  Lymphadenopathy:     Cervical: No cervical adenopathy.  Neurological:     Mental Status: He is alert and oriented to person, place, and time.  Psychiatric:        Mood and Affect: Mood normal.        Behavior: Behavior normal.      A/P:  1. Oral aphthous ulcer Rx: - triamcinolone (KENALOG) 0.1 % paste; Use as directed 1 application in the mouth or throat 2 (two)  times daily.  Dispense: 15 g; Refill: 1 - can also use topical anbesol or other anesthetic PRN - f/u if symptoms worsen or do not improve in 7-10 days Discussed plan and reviewed medications with patient, including risks, benefits, and potential side effects. Pt expressed understand. All questions answered.    This visit occurred during the SARS-CoV-2 public health emergency.  Safety protocols were in place, including screening questions prior to the visit, additional usage of staff PPE, and extensive cleaning of exam room while observing appropriate contact time as indicated for disinfecting solutions.

## 2019-10-30 NOTE — Patient Instructions (Signed)
Use Rx triamcinolone paste to ulcer 2x/day x 7-10 days OTC Orajel or Anbesol

## 2020-07-09 ENCOUNTER — Encounter: Payer: Self-pay | Admitting: Nurse Practitioner

## 2020-07-09 ENCOUNTER — Ambulatory Visit (INDEPENDENT_AMBULATORY_CARE_PROVIDER_SITE_OTHER): Payer: No Typology Code available for payment source | Admitting: Nurse Practitioner

## 2020-07-09 ENCOUNTER — Other Ambulatory Visit: Payer: Self-pay

## 2020-07-09 VITALS — BP 120/82 | HR 76 | Temp 98.7°F | Ht 72.0 in | Wt 193.0 lb

## 2020-07-09 DIAGNOSIS — D171 Benign lipomatous neoplasm of skin and subcutaneous tissue of trunk: Secondary | ICD-10-CM | POA: Insufficient documentation

## 2020-07-09 NOTE — Patient Instructions (Signed)
This is benign. No need for any scan or medication. Schedule another appt if it changes in size, becomes painful/red, caused skin retraction, or becomes fixed.  Lipoma  A lipoma is a noncancerous (benign) tumor that is made up of fat cells. This is a very common type of soft-tissue growth. Lipomas are usually found under the skin (subcutaneous). They may occur in any tissue of the body that contains fat. Common areas for lipomas to appear include the back, arms, shoulders, buttocks, and thighs. Lipomas grow slowly, and they are usually painless. Most lipomas do not cause problems and do not require treatment. What are the causes? The cause of this condition is not known. What increases the risk? You are more likely to develop this condition if:  You are 55-9 years old.  You have a family history of lipomas. What are the signs or symptoms? A lipoma usually appears as a small, round bump under the skin. In most cases, the lump will:  Feel soft or rubbery.  Not cause pain or other symptoms. However, if a lipoma is located in an area where it pushes on nerves, it can become painful or cause other symptoms. How is this diagnosed? A lipoma can usually be diagnosed with a physical exam. You may also have tests to confirm the diagnosis and to rule out other conditions. Tests may include:  Imaging tests, such as a CT scan or an MRI.  Removal of a tissue sample to be looked at under a microscope (biopsy). How is this treated? Treatment for this condition depends on the size of the lipoma and whether it is causing any symptoms.  For small lipomas that are not causing problems, no treatment is needed.  If a lipoma is bigger or it causes problems, surgery may be done to remove the lipoma. Lipomas can also be removed to improve appearance. Most often, the procedure is done after applying a medicine that numbs the area (local anesthetic).  Liposuction may be done to reduce the size of the lipoma  before it is removed through surgery, or it may be done to remove the lipoma. Lipomas are removed with this method in order to limit incision size and scarring. A liposuction tube is inserted through a small incision into the lipoma, and the contents of the lipoma are removed through the tube with suction. Follow these instructions at home:  Watch your lipoma for any changes.  Keep all follow-up visits as told by your health care provider. This is important. Contact a health care provider if:  Your lipoma becomes larger or hard.  Your lipoma becomes painful, red, or increasingly swollen. These could be signs of infection or a more serious condition. Get help right away if:  You develop tingling or numbness in an area near the lipoma. This could indicate that the lipoma is causing nerve damage. Summary  A lipoma is a noncancerous tumor that is made up of fat cells.  Most lipomas do not cause problems and do not require treatment.  If a lipoma is bigger or it causes problems, surgery may be done to remove the lipoma.  Contact a health care provider if your lipoma becomes larger or hard, or if it becomes painful, red, or increasingly swollen. Pain, redness, and swelling could be signs of infection or a more serious condition. This information is not intended to replace advice given to you by your health care provider. Make sure you discuss any questions you have with your health care provider.  Document Revised: 12/09/2018 Document Reviewed: 12/09/2018 Elsevier Patient Education  [place].

## 2020-07-09 NOTE — Assessment & Plan Note (Addendum)
Incidental finding by his wife On left mid back: soft, mobile, no tenderness, skin is intact and dry, about 4.5cm long x 1.8cm width, no erythema, no skin retraction.

## 2020-07-09 NOTE — Progress Notes (Signed)
   Subjective:  Patient ID: Billy Espinoza, male    DOB: 11/18/1995  Age: 25 y.o. MRN: [id number]  CC: Acute Visit (Pt c/o lump on left lower back. Denies pain, redness, or drainage. Denies any known injury. )  HPI Accompanied by wife.  Lipoma of back Incidental finding by his wife On left mid back: soft, mobile, no tenderness, skin is intact and dry, about 4.5cm long x 1.8cm width, no erythema, no skin retraction.  Reviewed past Medical, Social and Family history today.  Outpatient Medications Prior to Visit  Medication Sig Dispense Refill  . triamcinolone (KENALOG) 0.1 % paste Use as directed 1 application in the mouth or throat 2 (two) times daily. (Patient not taking: Reported on 07/09/2020) 15 g 1   No facility-administered medications prior to visit.    ROS See HPI  Objective:  BP 120/82 (BP Location: Left Arm, Patient Position: Sitting, Cuff Size: Large)   Pulse 76   Temp 98.7 F (37.1 C) (Temporal)   Ht 6' (1.829 m)   Wt 193 lb (87.5 kg)   SpO2 99%   BMI 26.18 kg/m   Physical Exam Vitals reviewed.  Skin:    General: Skin is warm and dry.     Findings: Lesion present. No erythema.       Neurological:     Mental Status: He is alert and oriented to person, place, and time.     Assessment & Plan:  This visit occurred during the SARS-CoV-2 public health emergency.  Safety protocols were in place, including screening questions prior to the visit, additional usage of staff PPE, and extensive cleaning of exam room while observing appropriate contact time as indicated for disinfecting solutions.   Blaise was seen today for acute visit.  Diagnoses and all orders for this visit:  Lipoma of back  This is benign. No need for any scan or medication. Schedule another appt if it changes in size, becomes painful/red, caused skin retraction, or becomes fixed.  Problem List Items Addressed This Visit      Other   Lipoma of back - Primary    Incidental finding by his  wife On left mid back: soft, mobile, no tenderness, skin is intact and dry, about 4.5cm long x 1.8cm width, no erythema, no skin retraction.         Follow-up: No follow-ups on file.  Wilfred Lacy, NP

## 2021-06-19 NOTE — Progress Notes (Signed)
Acute Office Visit  Subjective:    Patient ID: Billy Espinoza, male    DOB: 1995/12/14, 26 y.o.   MRN: [id number]  Chief Complaint  Patient presents with   Sore Throat    Pt c/o soreness in throat x3 weeks, no fever or other URI symptoms.     HPI Patient is in today for soreness in his throat on the right side when he swallows for the last 3 weeks. He also noted 1 week ago he had a sore neck, like muscle pain for 1 week that went away. He has not tried any treatments at home. The discomfort doesn't happen all the time, only when he swallows. He denies fever, cough, URI symptoms, heart burn, nausea, and abdominal pain.   History reviewed. No pertinent past medical history.  History reviewed. No pertinent surgical history.  Family History  Problem Relation Age of Onset   Diabetes Maternal Grandmother     Social History   Socioeconomic History   Marital status: Married    Spouse name: Not on file   Number of children: 2   Years of education: Not on file   Highest education level: Not on file  Occupational History   Not on file  Tobacco Use   Smoking status: Never   Smokeless tobacco: Never  Substance and Sexual Activity   Alcohol use: Yes    Alcohol/week: 3.0 standard drinks    Types: 3 Cans of beer per week   Drug use: No   Sexual activity: Yes    Birth control/protection: None  Other Topics Concern   Not on file  Social History Narrative   Not on file   Social Determinants of Health   Financial Resource Strain: Not on file  Food Insecurity: Not on file  Transportation Needs: Not on file  Physical Activity: Not on file  Stress: Not on file  Social Connections: Not on file  Intimate Partner Violence: Not on file    Outpatient Medications Prior to Visit  Medication Sig Dispense Refill   triamcinolone (KENALOG) 0.1 % paste Use as directed 1 application in the mouth or throat 2 (two) times daily. (Patient not taking: Reported on 07/09/2020) 15 g 1   No  facility-administered medications prior to visit.    No Known Allergies  Review of Systems See pertinent positives and negatives per HPI.    Objective:    Physical Exam Vitals and nursing note reviewed.  Constitutional:      Appearance: Normal appearance.  HENT:     Head: Normocephalic.     Right Ear: Tympanic membrane, ear canal and external ear normal.     Left Ear: Tympanic membrane, ear canal and external ear normal.     Mouth/Throat:     Mouth: Mucous membranes are moist.     Pharynx: Oropharynx is clear. No oropharyngeal exudate or uvula swelling.     Tonsils: No tonsillar exudate or tonsillar abscesses. 1+ on the right. 1+ on the left.  Eyes:     Conjunctiva/sclera: Conjunctivae normal.  Cardiovascular:     Rate and Rhythm: Normal rate and regular rhythm.     Pulses: Normal pulses.     Heart sounds: Normal heart sounds.  Pulmonary:     Effort: Pulmonary effort is normal.     Breath sounds: Normal breath sounds.  Musculoskeletal:     Cervical back: Normal range of motion.  Skin:    General: Skin is warm.  Neurological:     General:  No focal deficit present.     Mental Status: He is alert and oriented to person, place, and time.  Psychiatric:        Mood and Affect: Mood normal.        Behavior: Behavior normal.        Thought Content: Thought content normal.        Judgment: Judgment normal.    BP 124/78    Pulse (!) 58    Temp (!) 97.4 F (36.3 C) (Temporal)    Wt 195 lb 12.8 oz (88.8 kg)    SpO2 97%    BMI 26.56 kg/m  Wt Readings from Last 3 Encounters:  06/20/21 195 lb 12.8 oz (88.8 kg)  07/09/20 193 lb (87.5 kg)  10/30/19 197 lb 9.6 oz (89.6 kg)    Health Maintenance Due  Topic Date Due   Hepatitis C Screening  Never done   COVID-19 Vaccine (2 - Pfizer series) 09/11/2019   INFLUENZA VACCINE  12/06/2020    There are no preventive care reminders to display for this patient.   Lab Results  Component Value Date   TSH 1.54 02/26/2018   Lab  Results  Component Value Date   WBC 4.4 02/26/2018   HGB 16.5 02/26/2018   HCT 47.7 02/26/2018   MCV 87.0 02/26/2018   PLT 191.0 02/26/2018   Lab Results  Component Value Date   NA 139 02/26/2018   K 4.0 02/26/2018   CO2 30 02/26/2018   GLUCOSE 94 02/26/2018   BUN 15 02/26/2018   CREATININE 0.81 02/26/2018   BILITOT 0.5 02/26/2018   ALKPHOS 65 02/26/2018   AST 15 02/26/2018   ALT 16 02/26/2018   PROT 7.5 02/26/2018   ALBUMIN 4.7 02/26/2018   CALCIUM 9.6 02/26/2018   ANIONGAP 14 01/17/2014   GFR 124.12 02/26/2018   Lab Results  Component Value Date   CHOL 179 02/26/2018   Lab Results  Component Value Date   HDL 64.60 02/26/2018   Lab Results  Component Value Date   LDLCALC 90 02/26/2018   Lab Results  Component Value Date   TRIG 126.0 02/26/2018   Lab Results  Component Value Date   CHOLHDL 3 02/26/2018   No results found for: HGBA1C     Assessment & Plan:   Problem List Items Addressed This Visit   None Visit Diagnoses     Painful swallowing    -  Primary   No redness, exudate, tonsil stones noted on exam. Will place referral to ENT for ongoing symptoms >3 weeks. Gargle with warm salt water 4x/day.    Relevant Orders   Ambulatory referral to ENT       No orders of the defined types were placed in this encounter.   Charyl Dancer, NP

## 2021-06-20 ENCOUNTER — Encounter: Payer: Self-pay | Admitting: Nurse Practitioner

## 2021-06-20 ENCOUNTER — Ambulatory Visit: Payer: No Typology Code available for payment source | Admitting: Nurse Practitioner

## 2021-06-20 ENCOUNTER — Other Ambulatory Visit: Payer: Self-pay

## 2021-06-20 VITALS — BP 124/78 | HR 58 | Temp 97.4°F | Wt 195.8 lb

## 2021-06-20 DIAGNOSIS — R1310 Dysphagia, unspecified: Secondary | ICD-10-CM

## 2021-06-20 NOTE — Patient Instructions (Signed)
It was great to see you!  I have placed a referral to ENT.   Follow up if your symptoms worsen or with any other concerns.   If a referral was placed today, you will be contacted for an appointment. Please note that routine referrals can sometimes take up to 3-4 weeks to process. Please call our office if you haven't heard anything after this time frame.  Take care,  Vance Peper, NP

## 2022-07-03 ENCOUNTER — Encounter: Payer: Self-pay | Admitting: Nurse Practitioner

## 2022-07-03 ENCOUNTER — Ambulatory Visit: Payer: No Typology Code available for payment source | Admitting: Nurse Practitioner

## 2022-07-03 VITALS — BP 108/64 | HR 77 | Temp 98.8°F | Resp 16 | Ht 72.0 in | Wt 197.6 lb

## 2022-07-03 DIAGNOSIS — D171 Benign lipomatous neoplasm of skin and subcutaneous tissue of trunk: Secondary | ICD-10-CM | POA: Diagnosis not present

## 2022-07-03 NOTE — Progress Notes (Signed)
                Established Patient Visit  Patient: Billy Espinoza   DOB: [birth year]   27 y.o. Male  MRN: [id number] Visit Date: 07/03/2022  Subjective:    Chief Complaint  Patient presents with   Mass    Pt has a lump on  the upper right side of back that has gotten bigger-    HPI Lipoma of back He is concerned lipoma may have increased in size. He denies any pain or redness or paresthesia.  Measured size at 3cm length x 1.8cm width, soft, mobile, non tender, no skin retraction, no skin dimpling, no erythema or induration. Advised that lesion remains benign hence no reason for any testing or intervention at this time.  Reviewed medical, surgical, and social history today  Medications: Outpatient Medications Prior to Visit  Medication Sig   triamcinolone (KENALOG) 0.1 % paste Use as directed 1 application in the mouth or throat 2 (two) times daily. (Patient not taking: Reported on 07/09/2020)   No facility-administered medications prior to visit.   Reviewed past medical and social history.   ROS per HPI above      Objective:  BP 108/64 (BP Location: Left Arm, Patient Position: Sitting, Cuff Size: Normal)   Pulse 77   Temp 98.8 F (37.1 C) (Temporal)   Resp 16   Ht 6' (1.829 m)   Wt 197 lb 9.6 oz (89.6 kg)   SpO2 95%   BMI 26.80 kg/m      Physical Exam Cardiovascular:     Rate and Rhythm: Normal rate.     Pulses: Normal pulses.  Pulmonary:     Effort: Pulmonary effort is normal.  Skin:    Findings: Lesion present. No bruising, erythema or rash.       Neurological:     Mental Status: He is alert and oriented to person, place, and time.     No results found for any visits on 07/03/22.    Assessment & Plan:    Problem List Items Addressed This Visit       Other   Lipoma of back - Primary    He is concerned lipoma may have increased in size. He denies any pain or redness or paresthesia.  Measured size at 3cm length x 1.8cm width, soft, mobile, non  tender, no skin retraction, no skin dimpling, no erythema or induration. Advised that lesion remains benign hence no reason for any testing or intervention at this time.      Return in 8 weeks (on 08/28/2022) for CPE (fasting).     Wilfred Lacy, NP

## 2022-07-03 NOTE — Patient Instructions (Signed)
Lipoma has decreased in size. No concern at this time

## 2022-07-03 NOTE — Assessment & Plan Note (Signed)
He is concerned lipoma may have increased in size. He denies any pain or redness or paresthesia.  Measured size at 3cm length x 1.8cm width, soft, mobile, non tender, no skin retraction, no skin dimpling, no erythema or induration. Advised that lesion remains benign hence no reason for any testing or intervention at this time.

## 2022-08-21 ENCOUNTER — Ambulatory Visit: Payer: No Typology Code available for payment source | Admitting: Nurse Practitioner

## 2022-08-21 VITALS — BP 110/62 | HR 62 | Temp 98.6°F | Resp 16 | Ht 72.0 in | Wt 198.6 lb

## 2022-08-21 DIAGNOSIS — S39012A Strain of muscle, fascia and tendon of lower back, initial encounter: Secondary | ICD-10-CM | POA: Diagnosis not present

## 2022-08-21 MED ORDER — CYCLOBENZAPRINE HCL 5 MG PO TABS: 5.0000 mg | ORAL_TABLET | Freq: Every day | ORAL | 0 refills | Status: AC

## 2022-08-21 MED ORDER — IBUPROFEN 600 MG PO TABS: 600.0000 mg | ORAL_TABLET | Freq: Three times a day (TID) | ORAL | 0 refills | Status: AC

## 2022-08-21 NOTE — Patient Instructions (Signed)
Low Back Sprain or Strain Rehab Ask your health care provider which exercises are safe for you. Do exercises exactly as told by your health care provider and adjust them as directed. It is normal to feel mild stretching, pulling, tightness, or discomfort as you do these exercises. Stop right away if you feel sudden pain or your pain gets worse. Do not begin these exercises until told by your health care provider. Stretching and range-of-motion exercises These exercises warm up your muscles and joints and improve the movement and flexibility of your back. These exercises also help to relieve pain, numbness, and tingling. Lumbar rotation  Lie on your back on a firm bed or the floor with your knees bent. Straighten your arms out to your sides so each arm forms a 90-degree angle (right angle) with a side of your body. Slowly move (rotate) both of your knees to one side of your body until you feel a stretch in your lower back (lumbar). Try not to let your shoulders lift off the floor. Hold this position for __________ seconds. Tense your abdominal muscles and slowly move your knees back to the starting position. Repeat this exercise on the other side of your body. Repeat __________ times. Complete this exercise __________ times a day. Single knee to chest  Lie on your back on a firm bed or the floor with both legs straight. Bend one of your knees. Use your hands to move your knee up toward your chest until you feel a gentle stretch in your lower back and buttock. Hold your leg in this position by holding on to the front of your knee. Keep your other leg as straight as possible. Hold this position for __________ seconds. Slowly return to the starting position. Repeat with your other leg. Repeat __________ times. Complete this exercise __________ times a day. Prone extension on elbows  Lie on your abdomen on a firm bed or the floor (prone position). Prop yourself up on your elbows. Use your arms  to help lift your chest up until you feel a gentle stretch in your abdomen and your lower back. This will place some of your body weight on your elbows. If this is uncomfortable, try stacking pillows under your chest. Your hips should stay down, against the surface that you are lying on. Keep your hip and back muscles relaxed. Hold this position for __________ seconds. Slowly relax your upper body and return to the starting position. Repeat __________ times. Complete this exercise __________ times a day. Strengthening exercises These exercises build strength and endurance in your back. Endurance is the ability to use your muscles for a long time, even after they get tired. Pelvic tilt This exercise strengthens the muscles that lie deep in the abdomen. Lie on your back on a firm bed or the floor with your legs extended. Bend your knees so they are pointing toward the ceiling and your feet are flat on the floor. Tighten your lower abdominal muscles to press your lower back against the floor. This motion will tilt your pelvis so your tailbone points up toward the ceiling instead of pointing to your feet or the floor. To help with this exercise, you may place a small towel under your lower back and try to push your back into the towel. Hold this position for __________ seconds. Let your muscles relax completely before you repeat this exercise. Repeat __________ times. Complete this exercise __________ times a day. Alternating arm and leg raises  Get on your hands   and knees on a firm surface. If you are on a hard floor, you may want to use padding, such as an exercise mat, to cushion your knees. Line up your arms and legs. Your hands should be directly below your shoulders, and your knees should be directly below your hips. Lift your left leg behind you. At the same time, raise your right arm and straighten it in front of you. Do not lift your leg higher than your hip. Do not lift your arm higher  than your shoulder. Keep your abdominal and back muscles tight. Keep your hips facing the ground. Do not arch your back. Keep your balance carefully, and do not hold your breath. Hold this position for __________ seconds. Slowly return to the starting position. Repeat with your right leg and your left arm. Repeat __________ times. Complete this exercise __________ times a day. Abdominal set with straight leg raise  Lie on your back on a firm bed or the floor. Bend one of your knees and keep your other leg straight. Tense your abdominal muscles and lift your straight leg up, 4-6 inches (10-15 cm) off the ground. Keep your abdominal muscles tight and hold this position for __________ seconds. Do not hold your breath. Do not arch your back. Keep it flat against the ground. Keep your abdominal muscles tense as you slowly lower your leg back to the starting position. Repeat with your other leg. Repeat __________ times. Complete this exercise __________ times a day. Single leg lower with bent knees Lie on your back on a firm bed or the floor. Tense your abdominal muscles and lift your feet off the floor, one foot at a time, so your knees and hips are bent in 90-degree angles (right angles). Your knees should be over your hips and your lower legs should be parallel to the floor. Keeping your abdominal muscles tense and your knee bent, slowly lower one of your legs so your toe touches the ground. Lift your leg back up to return to the starting position. Do not hold your breath. Do not let your back arch. Keep your back flat against the ground. Repeat with your other leg. Repeat __________ times. Complete this exercise __________ times a day. Posture and body mechanics Good posture and healthy body mechanics can help to relieve stress in your body's tissues and joints. Body mechanics refers to the movements and positions of your body while you do your daily activities. Posture is part of body  mechanics. Good posture means: Your spine is in its natural S-curve position (neutral). Your shoulders are pulled back slightly. Your head is not tipped forward (neutral). Follow these guidelines to improve your posture and body mechanics in your everyday activities. Standing  When standing, keep your spine neutral and your feet about hip-width apart. Keep a slight bend in your knees. Your ears, shoulders, and hips should line up. When you do a task in which you stand in one place for a long time, place one foot up on a stable object that is 2-4 inches (5-10 cm) high, such as a footstool. This helps keep your spine neutral. Sitting  When sitting, keep your spine neutral and keep your feet flat on the floor. Use a footrest, if necessary, and keep your thighs parallel to the floor. Avoid rounding your shoulders, and avoid tilting your head forward. When working at a desk or a computer, keep your desk at a height where your hands are slightly lower than your elbows. Slide your   chair under your desk so you are close enough to maintain good posture. When working at a computer, place your monitor at a height where you are looking straight ahead and you do not have to tilt your head forward or downward to look at the screen. Resting When lying down and resting, avoid positions that are most painful for you. If you have pain with activities such as sitting, bending, stooping, or squatting, lie in a position in which your body does not bend very much. For example, avoid curling up on your side with your arms and knees near your chest (fetal position). If you have pain with activities such as standing for a long time or reaching with your arms, lie with your spine in a neutral position and bend your knees slightly. Try the following positions: Lying on your side with a pillow between your knees. Lying on your back with a pillow under your knees. Lifting  When lifting objects, keep your feet at least  shoulder-width apart and tighten your abdominal muscles. Bend your knees and hips and keep your spine neutral. It is important to lift using the strength of your legs, not your back. Do not lock your knees straight out. Always ask for help to lift heavy or awkward objects. This information is not intended to replace advice given to you by your health care provider. Make sure you discuss any questions you have with your health care provider. Document Revised: 07/12/2020 Document Reviewed: 07/12/2020 Elsevier Patient Education  2023 [employer]  

## 2022-08-21 NOTE — Progress Notes (Signed)
Established Patient Visit  Patient: Billy Espinoza   DOB: 08-22-1995   27 y.o. Male  MRN: [id number] Visit Date: 08/21/2022  Subjective:    Chief Complaint  Patient presents with   Back Pain    Right lower back pain, pain radiates down leg.  Started last week.  Took ibuprofen with no relief    Back Pain This is a new problem. The current episode started in the past 7 days (onset after repetitive lifting, bending and twisting at work). The problem occurs constantly. The problem has been gradually improving since onset. The pain is present in the lumbar spine. The quality of the pain is described as cramping and aching. The pain radiates to the right thigh. The pain is moderate. The pain is Worse during the day. The symptoms are aggravated by bending and twisting. Stiffness is present In the morning. Associated symptoms include leg pain. Pertinent negatives include no abdominal pain, bladder incontinence, bowel incontinence, chest pain, dysuria, fever, headaches, numbness, paresis, paresthesias, pelvic pain, perianal numbness, tingling, weakness or weight loss. Risk factors include poor posture. He has tried NSAIDs and home exercises for the symptoms. The treatment provided mild relief.  Current job requires repetitive bending, lifting, twisting, pulling and pushing.  Packages can weight up to 80Lbs. 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 Reports he is unable to take time off work due to lack of sick time and short staff.  Reviewed medical, surgical, and social history today  Medications: Outpatient Medications Prior to Visit  Medication Sig   triamcinolone (KENALOG) 0.1 % paste Use as directed 1 application in the mouth or throat 2 (two) times daily. (Patient not taking: Reported on 07/09/2020)   No facility-administered medications prior to visit.   Reviewed past medical and social history.   ROS per HPI above      Objective:  BP 110/62 (BP Location: Right Arm, Patient  Position: Sitting, Cuff Size: Large)   Pulse 62   Temp 98.6 F (37 C) (Temporal)   Resp 16   Ht 6' (1.829 m)   Wt 198 lb 9.6 oz (90.1 kg)   SpO2 97%   BMI 26.94 kg/m      Physical Exam Vitals reviewed.  Cardiovascular:     Rate and Rhythm: Normal rate.     Pulses: Normal pulses.  Pulmonary:     Effort: Pulmonary effort is normal.  Abdominal:     Palpations: Abdomen is soft.     Tenderness: There is no abdominal tenderness. There is no right CVA tenderness or left CVA tenderness.  Musculoskeletal:        General: Tenderness present.     Lumbar back: Spasms and tenderness present. No bony tenderness. Normal range of motion. Negative right straight leg raise test and negative left straight leg raise test. No scoliosis.     Right hip: Normal.     Left hip: Normal.     Right upper leg: Normal.     Left upper leg: Normal.     Right knee: Normal.     Left knee: Normal.     Right lower leg: Normal.     Left lower leg: Normal.  Neurological:     Mental Status: He is oriented to person, place, and time.     No results found for any visits on 08/21/22.    Assessment & Plan:    Problem List Items Addressed This  Visit   None Visit Diagnoses     Acute myofascial strain of lumbar region, initial encounter    -  Primary   Relevant Medications   ibuprofen (ADVIL) 600 MG tablet   cyclobenzaprine (FLEXERIL) 5 MG tablet     Work Heritage manager provided.  Return for maintain upcoming appt.     Alysia Penna, NP

## 2022-08-28 ENCOUNTER — Ambulatory Visit (INDEPENDENT_AMBULATORY_CARE_PROVIDER_SITE_OTHER): Payer: No Typology Code available for payment source | Admitting: Nurse Practitioner

## 2022-08-28 ENCOUNTER — Encounter: Payer: Self-pay | Admitting: Nurse Practitioner

## 2022-08-28 VITALS — BP 102/80 | HR 57 | Temp 98.8°F | Resp 16 | Ht 72.0 in | Wt 197.4 lb

## 2022-08-28 DIAGNOSIS — Z0000 Encounter for general adult medical examination without abnormal findings: Secondary | ICD-10-CM | POA: Diagnosis not present

## 2022-08-28 DIAGNOSIS — Z136 Encounter for screening for cardiovascular disorders: Secondary | ICD-10-CM

## 2022-08-28 DIAGNOSIS — Z13220 Encounter for screening for lipoid disorders: Secondary | ICD-10-CM | POA: Diagnosis not present

## 2022-08-28 NOTE — Progress Notes (Signed)
Complete physical exam  Patient: Billy Espinoza   DOB: [birth year]   26 y.o. Male  MRN: [id number] Visit Date: 08/28/2022  Subjective:    Chief Complaint  Patient presents with   Annual Exam    Fasting - No   Billy Espinoza is a 27 y.o. male who presents today for a complete physical exam. He reports consuming a general diet.  Stays active at working  He generally feels well. He reports sleeping well. He does have additional problems to discuss today.  Vision:Yes Dental:Yes STD Screen:No  BP Readings from Last 3 Encounters:  08/28/22 102/80  08/21/22 110/62  07/03/22 108/64   Wt Readings from Last 3 Encounters:  08/28/22 197 lb 6.4 oz (89.5 kg)  08/21/22 198 lb 9.6 oz (90.1 kg)  07/03/22 197 lb 9.6 oz (89.6 kg)   Most recent fall risk assessment:    07/03/2022    1:46 PM  Fall Risk   Falls in the past year? 0  Number falls in past yr: 0  Injury with Fall? 0  Risk for fall due to : No Fall Risks  Follow up Falls evaluation completed   Depression screen:Yes - No Depression  Most recent depression screenings:    07/03/2022    1:46 PM 02/26/2018    9:55 AM  PHQ 2/9 Scores  PHQ - 2 Score 0 0   HPI  No problem-specific Assessment & Plan notes found for this encounter.  No past medical history on file. No past surgical history on file. Social History   Socioeconomic History   Marital status: Married    Spouse name: Not on file   Number of children: 2   Years of education: Not on file   Highest education level: Associate degree: occupational, Scientist, product/process development, or vocational program  Occupational History   Not on file  Tobacco Use   Smoking status: Never   Smokeless tobacco: Never  Substance and Sexual Activity   Alcohol use: Yes    Alcohol/week: 3.0 standard drinks of alcohol    Types: 3 Cans of beer per week   Drug use: No   Sexual activity: Yes    Birth control/protection: None  Other Topics Concern   Not on file  Social History Narrative   Not on file    Social Determinants of Health   Financial Resource Strain: Low Risk  (08/21/2022)   Overall Financial Resource Strain (CARDIA)    Difficulty of Paying Living Expenses: Not very hard  Food Insecurity: No Food Insecurity (08/21/2022)   Hunger Vital Sign    Worried About Running Out of Food in the Last Year: Never true    Ran Out of Food in the Last Year: Never true  Transportation Needs: No Transportation Needs (08/21/2022)   PRAPARE - Administrator, Civil Service (Medical): No    Lack of Transportation (Non-Medical): No  Physical Activity: Sufficiently Active (08/21/2022)   Exercise Vital Sign    Days of Exercise per Week: 7 days    Minutes of Exercise per Session: 60 min  Stress: No Stress Concern Present (08/21/2022)   [employer] of Occupational Health - Occupational Stress Questionnaire    Feeling of Stress : Not at all  Social Connections: Moderately Isolated (08/21/2022)   Social Connection and Isolation Panel [NHANES]    Frequency of Communication with Friends and Family: More than three times a week    Frequency of Social Gatherings with Friends and Family: Once a week  Attends Religious Services: Never    Active Member of Clubs or Organizations: No    Attends Engineer, structural: Not on file    Marital Status: Married  Catering manager Violence: Not on file   Family Status  Relation Name Status   MGM  Alive   Mother  Alive   Father  Alive   MGF  Alive   PGM  Alive   PGF  Alive   Family History  Problem Relation Age of Onset   Diabetes Maternal Grandmother    No Known Allergies  Patient Care Team: Rohini Jaroszewski, Bonna Gains, NP as PCP - General (Internal Medicine)   Medications: Outpatient Medications Prior to Visit  Medication Sig   cyclobenzaprine (FLEXERIL) 5 MG tablet Take 1 tablet (5 mg total) by mouth at bedtime.   ibuprofen (ADVIL) 600 MG tablet Take 1 tablet (600 mg total) by mouth 3 (three) times daily. With food   triamcinolone  (KENALOG) 0.1 % paste Use as directed 1 application in the mouth or throat 2 (two) times daily. (Patient not taking: Reported on 07/09/2020)   No facility-administered medications prior to visit.   Review of Systems  Constitutional:  Negative for activity change, appetite change and unexpected weight change.  Respiratory: Negative.    Cardiovascular: Negative.   Gastrointestinal: Negative.   Endocrine: Negative for cold intolerance and heat intolerance.  Genitourinary: Negative.   Musculoskeletal: Negative.   Skin: Negative.   Neurological: Negative.   Hematological: Negative.   Psychiatric/Behavioral:  Negative for behavioral problems, decreased concentration, dysphoric mood, hallucinations, self-injury, sleep disturbance and suicidal ideas. The patient is not nervous/anxious.        Objective:  BP 102/80 (BP Location: Right Arm, Patient Position: Sitting, Cuff Size: Large)   Pulse (!) 57   Temp 98.8 F (37.1 C) (Temporal)   Resp 16   Ht 6' (1.829 m)   Wt 197 lb 6.4 oz (89.5 kg)   SpO2 97%   BMI 26.77 kg/m     Physical Exam Vitals and nursing note reviewed.  Constitutional:      General: He is not in acute distress. HENT:     Right Ear: Tympanic membrane, ear canal and external ear normal.     Left Ear: Tympanic membrane, ear canal and external ear normal.     Nose: Nose normal.  Eyes:     Extraocular Movements: Extraocular movements intact.     Conjunctiva/sclera: Conjunctivae normal.     Pupils: Pupils are equal, round, and reactive to light.  Neck:     Thyroid: No thyroid mass, thyromegaly or thyroid tenderness.  Cardiovascular:     Rate and Rhythm: Normal rate and regular rhythm.     Pulses: Normal pulses.     Heart sounds: Normal heart sounds.  Pulmonary:     Effort: Pulmonary effort is normal.     Breath sounds: Normal breath sounds.  Abdominal:     General: Bowel sounds are normal.     Palpations: Abdomen is soft.  Musculoskeletal:        General: Normal  range of motion.     Cervical back: Normal range of motion and neck supple.     Right lower leg: No edema.     Left lower leg: No edema.  Lymphadenopathy:     Cervical: No cervical adenopathy.  Skin:    General: Skin is warm and dry.  Neurological:     Mental Status: He is alert and oriented to person, place, and  time.     Cranial Nerves: No cranial nerve deficit.  Psychiatric:        Mood and Affect: Mood normal.        Behavior: Behavior normal.        Thought Content: Thought content normal.     No results found for any visits on 08/28/22.    Assessment & Plan:    Routine Health Maintenance and Physical Exam  Immunization History  Administered Date(s) Administered   DTaP 04/03/1994, 06/13/1994, 01/02/1995   HIB (PRP-OMP) 04/03/1994, 06/13/1994, 01/02/1995   Hepatitis B 04-19-94, 02/03/1994, 06/13/1994   IPV 02/03/1994, 04/03/1994, 06/13/1994   Influenza,inj,Quad PF,6+ Mos 02/26/2018   MMR 01/02/1995   PFIZER(Purple Top)SARS-COV-2 Vaccination 08/21/2019   Tdap 02/26/2018   Health Maintenance  Topic Date Due   COVID-19 Vaccine (2 - 2023-24 season) 01/06/2022   Hepatitis C Screening  08/28/2023 (Originally 11/11/2011)   INFLUENZA VACCINE  12/07/2022   DTaP/Tdap/Td (5 - Td or Tdap) 02/27/2028   HIV Screening  Completed   HPV VACCINES  Aged Out   Discussed health benefits of physical activity, and encouraged him to engage in regular exercise appropriate for his age and condition.  Problem List Items Addressed This Visit   None Visit Diagnoses     Preventative health care    -  Primary   Relevant Orders   Lipid panel   Comprehensive metabolic panel   Encounter for lipid screening for cardiovascular disease       Relevant Orders   Lipid panel      Return in about 1 year (around 08/28/2023) for CPE (fasting).     Alysia Penna, NP

## 2022-08-28 NOTE — Patient Instructions (Signed)
Schedule fasting lab appt. Need to be fasting 8hrs prior to blood draw. Ok to drink water and take BP meds. Continue Heart healthy diet and daily exercise.  Preventive Care 21-29 Years Old, Male Preventive care refers to lifestyle choices and visits with your health care provider that can promote health and wellness. Preventive care visits are also called wellness exams. What can I expect for my preventive care visit? Counseling During your preventive care visit, your health care provider may ask about your: Medical history, including: Past medical problems. Family medical history. Current health, including: Emotional well-being. Home life and relationship well-being. Sexual activity. Lifestyle, including: Alcohol, nicotine or tobacco, and drug use. Access to firearms. Diet, exercise, and sleep habits. Safety issues such as seatbelt and bike helmet use. Sunscreen use. Work and work Astronomer. Physical exam Your health care provider may check your: Height and weight. These may be used to calculate your BMI (body mass index). BMI is a measurement that tells if you are at a healthy weight. Waist circumference. This measures the distance around your waistline. This measurement also tells if you are at a healthy weight and may help predict your risk of certain diseases, such as type 2 diabetes and high blood pressure. Heart rate and blood pressure. Body temperature. Skin for abnormal spots. What immunizations do I need?  Vaccines are usually given at various ages, according to a schedule. Your health care provider will recommend vaccines for you based on your age, medical history, and lifestyle or other factors, such as travel or where you work. What tests do I need? Screening Your health care provider may recommend screening tests for certain conditions. This may include: Lipid and cholesterol levels. Diabetes screening. This is done by checking your blood sugar (glucose) after you  have not eaten for a while (fasting). Hepatitis B test. Hepatitis C test. HIV (human immunodeficiency virus) test. STI (sexually transmitted infection) testing, if you are at risk. Talk with your health care provider about your test results, treatment options, and if necessary, the need for more tests. Follow these instructions at home: Eating and drinking  Eat a healthy diet that includes fresh fruits and vegetables, whole grains, lean protein, and low-fat dairy products. Drink enough fluid to keep your urine pale yellow. Take vitamin and mineral supplements as recommended by your health care provider. Do not drink alcohol if your health care provider tells you not to drink. If you drink alcohol: Limit how much you have to 0-2 drinks a day. Know how much alcohol is in your drink. In the [place] one drink equals one 12 oz bottle of beer (355 mL), one 5 oz glass of wine (148 mL), or one 1 oz glass of hard liquor (44 mL). Lifestyle Brush your teeth every morning and night with fluoride toothpaste. Floss one time each day. Exercise for at least 30 minutes 5 or more days each week. Do not use any products that contain nicotine or tobacco. These products include cigarettes, chewing tobacco, and vaping devices, such as e-cigarettes. If you need help quitting, ask your health care provider. Do not use drugs. If you are sexually active, practice safe sex. Use a condom or other form of protection to prevent STIs. Find healthy ways to manage stress, such as: Meditation, yoga, or listening to music. Journaling. Talking to a trusted person. Spending time with friends and family. Minimize exposure to UV radiation to reduce your risk of skin cancer. Safety Always wear your seat belt while driving or riding  in a vehicle. Do not drive: If you have been drinking alcohol. Do not ride with someone who has been drinking. If you have been using any mind-altering substances or drugs. While texting. When  you are tired or distracted. Wear a helmet and other protective equipment during sports activities. If you have firearms in your house, make sure you follow all gun safety procedures. Seek help if you have been physically or sexually abused. What's next? Go to your health care provider once a year for an annual wellness visit. Ask your health care provider how often you should have your eyes and teeth checked. Stay up to date on all vaccines. This information is not intended to replace advice given to you by your health care provider. Make sure you discuss any questions you have with your health care provider. Document Revised: 10/20/2020 Document Reviewed: 10/20/2020 Elsevier Patient Education  2023 [employer].

## 2022-09-01 ENCOUNTER — Encounter: Payer: No Typology Code available for payment source | Admitting: Nurse Practitioner

## 2022-10-15 ENCOUNTER — Encounter: Payer: Self-pay | Admitting: Nurse Practitioner

## 2022-10-16 ENCOUNTER — Telehealth: Payer: Self-pay | Admitting: Nurse Practitioner

## 2022-10-16 ENCOUNTER — Encounter: Payer: No Typology Code available for payment source | Admitting: Nurse Practitioner

## 2022-10-16 NOTE — Telephone Encounter (Signed)
1st missed visit since 2021, letter sent via mychart, fee waived

## 2022-10-16 NOTE — Telephone Encounter (Signed)
Pt rescheduled due to his son's graduation was moved to today at this time. No letter sent.

## 2022-10-17 ENCOUNTER — Encounter: Payer: No Typology Code available for payment source | Admitting: Nurse Practitioner

## 2022-11-16 ENCOUNTER — Ambulatory Visit: Payer: No Typology Code available for payment source | Admitting: Internal Medicine

## 2022-11-16 ENCOUNTER — Encounter: Payer: Self-pay | Admitting: Internal Medicine

## 2022-11-16 VITALS — BP 116/70 | HR 60 | Temp 98.5°F | Ht 72.0 in | Wt 194.6 lb

## 2022-11-16 DIAGNOSIS — L237 Allergic contact dermatitis due to plants, except food: Secondary | ICD-10-CM | POA: Diagnosis not present

## 2022-11-16 MED ORDER — TRIAMCINOLONE ACETONIDE 40 MG/ML IJ SUSP
40.0000 mg | Freq: Once | INTRAMUSCULAR | Status: AC
  Administered 2022-11-16: 40 mg via INTRAMUSCULAR

## 2022-11-16 MED ORDER — HALOBETASOL PROPIONATE 0.05 % EX OINT: TOPICAL_OINTMENT | CUTANEOUS | 2 refills | Status: AC

## 2022-11-16 NOTE — Progress Notes (Signed)
[facility] [address] [place] [address] Dept: [phone] Dept Fax: [phone]  Acute Care Office Visit  Subjective:   Billy Espinoza 1996-01-27 11/16/2022  Chief Complaint  Patient presents with   Poison Ivy    Monday    HPI: Discussed the use of AI scribe software for clinical note transcription with the patient, who gave verbal consent to proceed.  History of Present Illness   The patient presents with a widespread rash on both arms and legs, which he attributes to exposure to poison ivy. The rash began after he used a weed eater that had been used to cut poison ivy. The rash is described as 'nasty' with 'a lot of little vesicles.' It is also itchy and somewhat swollen. He has been managing the rash with calamine lotion and another over-the-counter cream starting with an 'S.' He has not previously received a steroid shot for poison ivy but is requesting one due to the severity of the current rash and an upcoming beach trip. He has been disciplined about not scratching the rash, which has spread due to his work involving reaching into tight spaces.       The following portions of the patient's history were reviewed and updated as appropriate: past medical history, past surgical history, family history, social history, allergies, medications, and problem list.   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 No pertinent past medical history. History reviewed. No pertinent surgical history. Family History  Problem Relation Age of Onset   Diabetes Maternal Grandmother    Outpatient Medications Prior to Visit  Medication Sig Dispense Refill   cyclobenzaprine (FLEXERIL) 5 MG tablet Take 1 tablet (5 mg total) by mouth at bedtime. (Patient not taking: Reported on 11/16/2022) 14 tablet 0   ibuprofen (ADVIL) 600 MG tablet Take 1 tablet (600 mg total) by mouth 3  (three) times daily. With food (Patient not taking: Reported on 11/16/2022) 30 tablet 0   triamcinolone (KENALOG) 0.1 % paste Use as directed 1 application in the mouth or throat 2 (two) times daily. (Patient not taking: Reported on 07/09/2020) 15 g 1   No facility-administered medications prior to visit.   No Known Allergies   ROS: A complete ROS was performed with pertinent positives/negatives noted in the HPI. The remainder of the ROS are negative.    Objective:   Today's Vitals   11/16/22 1403  BP: 116/70  Pulse: 60  Temp: 98.5 F (36.9 C)  TempSrc: Temporal  SpO2: 98%  Weight: 194 lb 9.6 oz (88.3 kg)  Height: 6' (1.829 m)      GENERAL: Well-appearing, in NAD. Well nourished.  SKIN: Pink, warm and dry. Scattered clusters of erythematous papules and vesicles to arms and legs. Small area of erythema to left cheek, no vesicles present.   RESPIRATORY: Respirations even and non-labored.  EXTREMITIES: Without clubbing, cyanosis, or edema.  NEUROLOGIC:  Steady, even gait.  PSYCH/MENTAL STATUS: Alert, oriented x 3. Cooperative, appropriate mood and affect.    No results found for any visits on 11/16/22.    Assessment & Plan:  Assessment and Plan    Poison Ivy Dermatitis: Widespread rash on both arms and legs with severe itching. Patient has been using calamine lotion and another over-the-counter cream with limited relief. No history of steroid shot for this condition. -Administer Kenalog 40mg  IM today. -Prescribe Halobetasol ointment 0.05%, apply to affected areas 1-2  times daily for up to 2 weeks. -Advise patient to continue calamine lotion if needed. -Advise patient to take Benadryl at night for itching. -If symptoms worsen or fail to improve, patient to follow up.  Work Excuse: Patient requested work excuse for tomorrow to rest. -Provide work excuse for tomorrow.       Meds ordered this encounter  Medications   triamcinolone acetonide (KENALOG-40) injection 40 mg    halobetasol (ULTRAVATE) 0.05 % ointment    Sig: Apply to affected area 1-2 times a day for up to 2 weeks as needed for itching/rash.    Dispense:  15 g    Refill:  2    Order Specific Question:   Supervising Provider    Answer:   Garnette Gunner [[id number]]   Lab Orders  No laboratory test(s) ordered today   No images are attached to the encounter or orders placed in the encounter.  Return if symptoms worsen or fail to improve.   Of note, portions of this note may have been created with voice recognition software Physicist, medical). While this note has been edited for accuracy, occasional wrong-word or 'sound-a-like' substitutions may have occurred due to the inherent limitations of voice recognition software.   Salvatore Decent, FNP

## 2022-11-16 NOTE — Patient Instructions (Signed)
Can take benadryl 25mg  at nighttime as needed for itching  Can continue Calamine lotion if needed

## 2022-11-27 ENCOUNTER — Encounter: Payer: No Typology Code available for payment source | Admitting: Nurse Practitioner

## 2023-09-03 ENCOUNTER — Ambulatory Visit: Admitting: Nurse Practitioner

## 2023-09-03 ENCOUNTER — Encounter: Payer: Self-pay | Admitting: Nurse Practitioner

## 2023-09-03 VITALS — BP 110/70 | HR 55 | Temp 98.7°F | Ht 72.0 in | Wt 194.2 lb

## 2023-09-03 DIAGNOSIS — J309 Allergic rhinitis, unspecified: Secondary | ICD-10-CM | POA: Diagnosis not present

## 2023-09-03 MED ORDER — FEXOFENADINE HCL 180 MG PO TABS: 180.0000 mg | ORAL_TABLET | Freq: Every day | ORAL | Status: AC

## 2023-09-03 MED ORDER — AZELASTINE HCL 0.1 % NA SOLN: 1.0000 | Freq: Two times a day (BID) | NASAL | 5 refills | Status: AC

## 2023-09-03 MED ORDER — FLUTICASONE PROPIONATE 50 MCG/ACT NA SUSP: 2.0000 | Freq: Every day | NASAL | 5 refills | Status: AC
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
## 2023-09-03 NOTE — Patient Instructions (Signed)
 Stop zyrtec Call office for allergist referral if no improvement in 2-4weeks.  Allergic Rhinitis, Adult  Allergic rhinitis is an allergic reaction that affects the mucous membrane inside the nose. The mucous membrane is the tissue that produces mucus. There are two types of allergic rhinitis: Seasonal. This type is also called hay fever and happens only during certain seasons. Perennial. This type can happen at any time of the year. Allergic rhinitis cannot be spread from person to person. This condition can be mild, bad, or very bad. It can develop at any age and may be outgrown. What are the causes? This condition is caused by allergens. These are things that can cause an allergic reaction. Allergens may differ for seasonal allergic rhinitis and perennial allergic rhinitis. Seasonal allergic rhinitis is caused by pollen. Pollen can come from grasses, trees, and weeds. Perennial allergic rhinitis may be caused by: Dust mites. Proteins in a pet's pee (urine), saliva, or dander. Dander is dead skin cells from a pet. Smoke, mold, or car fumes. Remains of or waste from insects such as cockroaches. What increases the risk? You are more likely to develop this condition if you have a family history of allergies or other conditions related to allergies, including: Allergic conjunctivitis. This is irritation and swelling of parts of the eyes and eyelids. Asthma. This condition affects the lungs and makes it hard to breathe. Atopic dermatitis or eczema. This is long term (chronic) irritation and swelling of the skin. Food allergies. What are the signs or symptoms? Symptoms of this condition include: Sneezing or coughing. A stuffy nose (nasal congestion), itchy nose, or nasal discharge. Itchy eyes and tearing of the eyes. A feeling of mucus dripping down the back of your throat (postnasal drip). This may cause a sore throat. Trouble sleeping. Tiredness. Headache. How is this diagnosed? This  condition may be diagnosed with your symptoms, your medical history, and a physical exam. Your health care provider may check for related conditions, such as: Asthma. Pink eye. This is eye swelling and irritation caused by infection (conjunctivitis). Ear infection. Upper respiratory infection. This is an infection in the nose, throat, or upper airways. You may also have tests to find out which allergens cause your symptoms. These may include skin tests or blood tests. How is this treated? There is no cure for this condition, but treatment can help control symptoms. Treatment may include: Taking medicines that block allergy symptoms, such as corticosteroids (anti-inflammatories) and antihistamines. Medicine may be given as a shot, nasal spray, or pill. Avoiding any allergens. Being exposed again and again to tiny amounts of allergens to help you build a defense against allergens (allergenimmunotherapy). This is done if other treatments have not helped. It may include: Allergy shots. These are injected medicines that have small amounts of an allergen in them. Sublingual immunotherapy. This involves taking small doses of a medicine with an allergen in it under your tongue. If these treatments do not work, your provider may prescribe newer, stronger medicines. Follow these instructions at home: Avoiding allergens Find out what you are allergic to and avoid those allergens. These are some things you can do to help avoid allergens: If you have perennial allergies: Replace carpet with wood, tile, or vinyl flooring. Carpet can trap dander and dust. Do not smoke. Do not allow smoking in your home Change your heating and air conditioning filters at least once a month. If you have seasonal allergies, take these steps during allergy season: Keep windows closed as much as  possible. Plan outdoor activities when pollen counts are lowest. Check pollen counts before you plan outdoor activities When coming  indoors, change clothing and shower before sitting on furniture or bedding. If you have a pet in the house that produces allergens: Keep the pet out of the bedroom. Vacuum, sweep, and dust regularly. General instructions Take over-the-counter and prescription medicines only as told by your provider. Drink enough fluid to keep your pee pale yellow. Where to find more information American Academy of Allergy, Asthma & Immunology: aaaai.org Contact a health care provider if: You have a fever. You develop a cough that does not go away. You make high-pitched whistling sounds when you breathe, most often when you breathe out (wheeze). Your symptoms slow you down or stop you from doing your normal activities each day. Get help right away if: You have shortness of breath. This symptom may be an emergency. Get help right away. Call 911. Do not wait to see if the symptoms will go away. Do not drive yourself to the hospital. This information is not intended to replace advice given to you by your health care provider. Make sure you discuss any questions you have with your health care provider. Document Revised: 01/02/2022 Document Reviewed: 01/02/2022 Elsevier Patient Education  2024 [employer].

## 2023-10-01 ENCOUNTER — Encounter: Payer: Self-pay | Admitting: Nurse Practitioner

## 2023-10-01 DIAGNOSIS — J309 Allergic rhinitis, unspecified: Secondary | ICD-10-CM

## 2023-10-01 DIAGNOSIS — J329 Chronic sinusitis, unspecified: Secondary | ICD-10-CM

## 2023-10-02 MED ORDER — METHYLPREDNISOLONE 4 MG PO TBPK: ORAL_TABLET | ORAL | 0 refills | Status: AC

## 2023-10-02 MED ORDER — MOXIFLOXACIN HCL 400 MG PO TABS: 400.0000 mg | ORAL_TABLET | Freq: Every day | ORAL | 0 refills | Status: AC

## 2023-10-10 ENCOUNTER — Encounter (INDEPENDENT_AMBULATORY_CARE_PROVIDER_SITE_OTHER): Payer: Self-pay | Admitting: Otolaryngology

## 2023-11-05 NOTE — Progress Notes (Deleted)
 New Patient Note  RE: Billy Espinoza MRN: [id number] DOB: May 27, 1995 Date of Office Visit: 11/06/2023  Consult requested by: Katheen Roselie Rockford, NP Primary care provider: Katheen Roselie Rockford, NP  Chief Complaint: No chief complaint on file.  History of Present Illness: I had the pleasure of seeing Billy Espinoza for initial evaluation at the [facility] on 11/06/2023. He is a 28 y.o. male, who is referred here by Nche, Roselie Rockford, NP for the evaluation of ***.  Discussed the use of AI scribe software for clinical note transcription with the patient, who gave verbal consent to proceed.  History of Present Illness             ***  Assessment and Plan: Billy Espinoza is a 28 y.o. male with: ***  Assessment and Plan               No follow-ups on file.  No orders of the defined types were placed in this encounter.  Lab Orders  No laboratory test(s) ordered today    Other allergy screening: Asthma: {Blank single:19197::yes,no} Rhino conjunctivitis: {Blank single:19197::yes,no} Food allergy: {Blank single:19197::yes,no} Medication allergy: {Blank single:19197::yes,no} Hymenoptera allergy: {Blank single:19197::yes,no} Urticaria: {Blank single:19197::yes,no} Eczema:{Blank single:19197::yes,no} History of recurrent infections suggestive of immunodeficency: {Blank single:19197::yes,no}  Diagnostics: Spirometry:  Tracings reviewed. His effort: {Blank single:19197::Good reproducible efforts.,It was hard to get consistent efforts and there is a question as to whether this reflects a maximal maneuver.,Poor effort, data can not be interpreted.} FVC: ***L FEV1: ***L, ***% predicted FEV1/FVC ratio: ***% Interpretation: {Blank single:19197::Spirometry consistent with mild obstructive disease,Spirometry consistent with moderate obstructive disease,Spirometry consistent with severe obstructive disease,Spirometry  consistent with possible restrictive disease,Spirometry consistent with mixed obstructive and restrictive disease,Spirometry uninterpretable due to technique,Spirometry consistent with normal pattern,No overt abnormalities noted given today's efforts}.  Please see scanned spirometry results for details.  Skin Testing: {Blank single:19197::Select foods,Environmental allergy panel,Environmental allergy panel and select foods,Food allergy panel,None,Deferred due to recent antihistamines use}. *** Results discussed with patient/family.   Past Medical History: Patient Active Problem List   Diagnosis Date Noted  . Chronic allergic rhinitis 09/03/2023  . 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 Past Surgical History: No past surgical history on file. Medication List:  Current Outpatient Medications  Medication Sig Dispense Refill  . azelastine  (ASTELIN ) 0.1 % nasal spray Place 1 spray into both nostrils 2 (two) times daily. 30 mL 5  . cyclobenzaprine  (FLEXERIL ) 5 MG tablet Take 1 tablet (5 mg total) by mouth at bedtime. (Patient not taking: Reported on 11/16/2022) 14 tablet 0  . fexofenadine  (ALLEGRA  ALLERGY) 180 MG tablet Take 1 tablet (180 mg total) by mouth daily.    . fluticasone  (FLONASE ) 50 MCG/ACT nasal spray Place 2 sprays into both nostrils daily. 16 g 5  . halobetasol  (ULTRAVATE ) 0.05 % ointment Apply to affected area 1-2 times a day for up to 2 weeks as needed for itching/rash. (Patient not taking: Reported on 09/03/2023) 15 g 2  . ibuprofen  (ADVIL ) 600 MG tablet Take 1 tablet (600 mg total) by mouth 3 (three) times daily. With food (Patient not taking: Reported on 11/16/2022) 30 tablet 0  . methylPREDNISolone  (MEDROL  DOSEPAK) 4 MG TBPK tablet Take as directed on package 21 tablet 0  . triamcinolone  (KENALOG ) 0.1 % paste Use as directed 1 application in the mouth or throat 2 (two) times daily. (Patient not taking: Reported on 07/09/2020) 15 g 1    No current facility-administered medications for  this visit.   Allergies: No Known Allergies Social History: Social History   Socioeconomic History  . Marital status: Married    Spouse name: Not on file  . Number of children: 2  . Years of education: Not on file  . Highest education level: Associate degree: occupational, Scientist, product/process development, or vocational program  Occupational History  . Not on file  Tobacco Use  . Smoking status: Never  . Smokeless tobacco: Never  Substance and Sexual Activity  . Alcohol use: Yes    Alcohol/week: 3.0 standard drinks of alcohol    Types: 3 Cans of beer per week  . Drug use: No  . Sexual activity: Yes    Birth control/protection: None  Other Topics Concern  . Not on file  Social History Narrative  . Not on file   Social Drivers of Health   Financial Resource Strain: Low Risk  (08/21/2022)   Overall Financial Resource Strain (CARDIA)   . Difficulty of Paying Living Expenses: Not very hard  Food Insecurity: No Food Insecurity (08/21/2022)   Hunger Vital Sign   . Worried About Programme researcher, broadcasting/film/video in the Last Year: Never true   . Ran Out of Food in the Last Year: Never true  Transportation Needs: No Transportation Needs (08/21/2022)   PRAPARE - Transportation   . Lack of Transportation (Medical): No   . Lack of Transportation (Non-Medical): No  Physical Activity: Sufficiently Active (08/21/2022)   Exercise Vital Sign   . Days of Exercise per Week: 7 days   . Minutes of Exercise per Session: 60 min  Stress: No Stress Concern Present (08/21/2022)   [employer] of Occupational Health - Occupational Stress Questionnaire   . Feeling of Stress : Not at all  Social Connections: Moderately Isolated (08/21/2022)   Social Connection and Isolation Panel   . Frequency of Communication with Friends and Family: More than three times a week   . Frequency of Social Gatherings with Friends and Family: Once a week   . Attends Religious Services: Never   .  Active Member of Clubs or Organizations: No   . Attends Banker Meetings: Not on file   . Marital Status: Married   Lives in a ***. Smoking: *** Occupation: ***  Environmental HistorySurveyor, minerals in the house: Network engineer in the family room: {Blank single:19197::yes,no} Carpet in the bedroom: {Blank single:19197::yes,no} Heating: {Blank single:19197::electric,gas,heat pump} Cooling: {Blank single:19197::central,window,heat pump} Pet: {Blank single:19197::yes ***,no}  Family History: Family History  Problem Relation Age of Onset  . Diabetes Maternal Grandmother    Problem                               Relation Asthma                                   *** Eczema                                *** Food allergy                          *** Allergic rhino conjunctivitis     ***  Review of Systems  Constitutional:  Negative for appetite change, chills, fever and unexpected weight change.  HENT:  Negative for  congestion and rhinorrhea.   Eyes:  Negative for itching.  Respiratory:  Negative for cough, chest tightness, shortness of breath and wheezing.   Cardiovascular:  Negative for chest pain.  Gastrointestinal:  Negative for abdominal pain.  Genitourinary:  Negative for difficulty urinating.  Skin:  Negative for rash.  Neurological:  Negative for headaches.   Objective: There were no vitals taken for this visit. There is no height or weight on file to calculate BMI. Physical Exam Vitals and nursing note reviewed.  Constitutional:      Appearance: Normal appearance. He is well-developed.  HENT:     Head: Normocephalic and atraumatic.     Right Ear: Tympanic membrane and external ear normal.     Left Ear: Tympanic membrane and external ear normal.     Nose: Nose normal.     Mouth/Throat:     Mouth: Mucous membranes are moist.     Pharynx: Oropharynx is clear.   Eyes:     Conjunctiva/sclera:  Conjunctivae normal.    Cardiovascular:     Rate and Rhythm: Normal rate and regular rhythm.     Heart sounds: Normal heart sounds. No murmur heard.    No friction rub. No gallop.  Pulmonary:     Effort: Pulmonary effort is normal.     Breath sounds: Normal breath sounds. No wheezing, rhonchi or rales.   Musculoskeletal:     Cervical back: Neck supple.   Skin:    General: Skin is warm.     Findings: No rash.   Neurological:     Mental Status: He is alert and oriented to person, place, and time.   Psychiatric:        Behavior: Behavior normal.  The plan was reviewed with the patient/family, and all questions/concerned were addressed.  It was my pleasure to see Billy Espinoza today and participate in his care. Please feel free to contact me with any questions or concerns.  Sincerely,  Orlan Cramp, DO Allergy & Immunology  [facility]  [place] office: [phone] [facility] office: [phone]

## 2023-11-06 ENCOUNTER — Ambulatory Visit: Payer: Self-pay | Admitting: Allergy

## 2024-06-05 ENCOUNTER — Encounter: Payer: Self-pay | Admitting: Nurse Practitioner
